# Patient Record
Sex: Female | Born: 1955 | Race: White | Hispanic: No | Marital: Married | Smoking: Former smoker
Health system: Southern US, Community
[De-identification: ages and names within clinical notes are randomized; demographics above are authoritative.]

## PROBLEM LIST (undated history)

## (undated) DIAGNOSIS — Z951 Presence of aortocoronary bypass graft: Secondary | ICD-10-CM

## (undated) DIAGNOSIS — E119 Type 2 diabetes mellitus without complications: Secondary | ICD-10-CM

## (undated) DIAGNOSIS — E782 Mixed hyperlipidemia: Secondary | ICD-10-CM

## (undated) DIAGNOSIS — I2510 Atherosclerotic heart disease of native coronary artery without angina pectoris: Secondary | ICD-10-CM

## (undated) DIAGNOSIS — Z87898 Personal history of other specified conditions: Secondary | ICD-10-CM

## (undated) DIAGNOSIS — I10 Essential (primary) hypertension: Secondary | ICD-10-CM

## (undated) DIAGNOSIS — I5022 Chronic systolic (congestive) heart failure: Secondary | ICD-10-CM

## (undated) HISTORY — PX: CARPAL TUNNEL RELEASE: SHX101

## (undated) HISTORY — DX: Type 2 diabetes mellitus without complications: E11.9

## (undated) HISTORY — DX: Personal history of other specified conditions: Z87.898

## (undated) HISTORY — DX: Chronic systolic (congestive) heart failure: I50.22

## (undated) HISTORY — DX: Essential (primary) hypertension: I10

## (undated) HISTORY — PX: BREAST LUMPECTOMY: SHX2

## (undated) HISTORY — DX: Presence of aortocoronary bypass graft: Z95.1

## (undated) HISTORY — PX: BILATERAL SALPINGOOPHORECTOMY: SHX1223

## (undated) HISTORY — DX: Atherosclerotic heart disease of native coronary artery without angina pectoris: I25.10

## (undated) HISTORY — DX: Mixed hyperlipidemia: E78.2

---

## 2004-03-18 ENCOUNTER — Other Ambulatory Visit: Admission: RE | Admit: 2004-03-18 | Discharge: 2004-03-18 | Payer: Self-pay | Admitting: Dermatology

## 2004-10-25 ENCOUNTER — Ambulatory Visit: Payer: Self-pay | Admitting: Cardiology

## 2005-06-29 ENCOUNTER — Ambulatory Visit: Payer: Self-pay | Admitting: Cardiology

## 2006-09-05 ENCOUNTER — Ambulatory Visit: Payer: Self-pay | Admitting: Cardiology

## 2008-08-22 ENCOUNTER — Ambulatory Visit: Payer: Self-pay | Admitting: Cardiology

## 2011-01-21 ENCOUNTER — Encounter: Payer: Self-pay | Admitting: Cardiology

## 2011-01-21 ENCOUNTER — Ambulatory Visit (INDEPENDENT_AMBULATORY_CARE_PROVIDER_SITE_OTHER): Payer: PRIVATE HEALTH INSURANCE | Admitting: Cardiology

## 2011-01-21 VITALS — Ht 65.0 in | Wt 159.0 lb

## 2011-01-21 DIAGNOSIS — I10 Essential (primary) hypertension: Secondary | ICD-10-CM

## 2011-01-21 DIAGNOSIS — E782 Mixed hyperlipidemia: Secondary | ICD-10-CM

## 2011-01-21 DIAGNOSIS — I2510 Atherosclerotic heart disease of native coronary artery without angina pectoris: Secondary | ICD-10-CM

## 2011-01-21 DIAGNOSIS — E785 Hyperlipidemia, unspecified: Secondary | ICD-10-CM

## 2011-01-21 DIAGNOSIS — I739 Peripheral vascular disease, unspecified: Secondary | ICD-10-CM | POA: Insufficient documentation

## 2011-01-21 DIAGNOSIS — E119 Type 2 diabetes mellitus without complications: Secondary | ICD-10-CM | POA: Insufficient documentation

## 2011-01-21 DIAGNOSIS — R002 Palpitations: Secondary | ICD-10-CM | POA: Insufficient documentation

## 2011-01-21 DIAGNOSIS — Z79899 Other long term (current) drug therapy: Secondary | ICD-10-CM

## 2011-01-21 MED ORDER — SIMVASTATIN 40 MG PO TABS: 40.0000 mg | ORAL_TABLET | Freq: Every evening | ORAL | Status: DC

## 2011-01-21 NOTE — Progress Notes (Signed)
HPI The patient is a very pleasant 55 year old female last seen in 2008. The patient has known atherosclerotic vascular disease as identified by prior cardiac CT scan and more recently with abdominal ultrasound which showed no significant aneurysm but there was clearly atherosclerotic vascular changes. The patient is actually seen today for history of tachycardia palpitations which has been going on for the last 2 years. The patient states that these episodes occur intermittently typically last 30-40 minutes. She was prescribed metoprolol by primary care physician with significant improvement in her symptoms however recently she was at a wedding and still reported palpitations. There is no presyncope or syncope associated with this nor is there any chest pain or shortness of breath. Although her symptoms have improved on beta blocker therapy no documentation has been obtained either with a Holter monitor CardioNet monitor. We do not know the patient atrial fibrillation which could be a significant problem in the setting of her known vascular disease, insulin-dependent diabetes mellitus dyslipidemia hypertension as well as female sex. 12-lead electrocardiogram shows normal sinus rhythm no acute ischemic changes. Of note is that a cardiac CT scan 4 years ago showed a calcium score of 281 and scattered areas of noncritical CAD with tandem 50% RCA stenosis, 70% circumflex stenosis and 50-70% proximal LAD. A followup stress echocardiogram showed no ischemia. Procedures were performed in 2007. A limited bedside echocardiogram was performed which showed the patient has normal LV function and no significant valvular abnormalities.  Allergies  Allergen Reactions  . Sulfa Antibiotics     No current outpatient prescriptions on file prior to visit.    Past Medical History  Diagnosis Date  . Diabetes mellitus     insulin dependent  . Dyslipidemia   . Hypertension     No past surgical history on file.  No  family history on file.  History   Social History  . Marital Status: Single    Spouse Name: N/A    Number of Children: N/A  . Years of Education: N/A   Occupational History  . Not on file.   Social History Main Topics  . Smoking status: Former Smoker -- 0.3 packs/day for 10 years    Types: Cigarettes    Quit date: 05/02/2000  . Smokeless tobacco: Never Used  . Alcohol Use: Not on file  . Drug Use: Not on file  . Sexually Active: Not on file   Other Topics Concern  . Not on file   Social History Narrative  . No narrative on file   [id number] positives as outlined above. The remainder of the 18  point review of systems is negative  PHYSICAL EXAM Ht 5\' 5"  (1.651 m)  Wt 159 lb (72.122 kg)  BMI 26.46 kg/m2  General: Well-developed, well-nourished in no distress Head: Normocephalic and atraumatic Eyes:PERRLA/EOMI intact, conjunctiva and lids normal Ears: No deformity or lesions Mouth:normal dentition, normal posterior pharynx Neck: Supple, no JVD.  No masses, thyromegaly or abnormal cervical nodes Lungs: Normal breath sounds bilaterally without wheezing.  Normal percussion Cardiac: regular rate and rhythm with normal S1 and S2, no S3 or S4.  PMI is normal.  No pathological murmurs Abdomen: Normal bowel sounds, abdomen is soft and nontender without masses, organomegaly or hernias noted.  No hepatosplenomegaly MSK: Back normal, normal gait muscle strength and tone normal Vascular: Pulse is normal in all 4 extremities Extremities: No peripheral pitting edema Neurologic: Alert and oriented x 3 Skin: Intact without lesions or rashes Lymphatics: No significant adenopathy Psychologic: Normal affect  ECG: Normal sinus rhythm no acute ischemic changes  ASSESSMENT AND PLAN

## 2011-01-21 NOTE — Patient Instructions (Signed)
Follow up as scheduled. Start Simvastatin 40 mg every night. Your physician recommends that you go to the [facility] for a FASTING lipid profile and liver function labs. Do not eat or drink after midnight. DO IN 6-8 WEEKS. Your physician has recommended that you wear an event monitor. Event monitors are medical devices that record the heart's electrical activity. Doctors most often [place] these monitors to diagnose arrhythmias. Arrhythmias are problems with the speed or rhythm of the heartbeat. The monitor is a small, portable device. You can wear one while you do your normal daily activities. This is usually used to diagnose what is causing palpitations/syncope (passing out).

## 2011-01-21 NOTE — Assessment & Plan Note (Signed)
Palpitations: Rule out atrial fibrillation CardioNet monitor. If the patient has atrial fibrillation given her multiple risk factors she will need to go on anticoagulant therapy. Continue beta blocker the meanwhile

## 2011-01-21 NOTE — Assessment & Plan Note (Signed)
Dyslipidemia: Given the fact the patient has diabetes mellitus and known vascular disease we will aim for goals for secondary risk prevention with an LDL less than 75 mg percent. The patient be started on simvastatin 40 minutes by mouth each bedtime.

## 2011-01-21 NOTE — Assessment & Plan Note (Signed)
Peripheral vascular disease: Aortic atherosclerotic changes is identified by CT scan of the abdomen

## 2011-01-21 NOTE — Assessment & Plan Note (Signed)
Coronary artery disease: Asymptomatic, nonobstructive CAD by cardiac CT scan 4 years ago with elevated calcium score. Would consider screening stress test after evaluation for arrhythmias

## 2011-01-27 DIAGNOSIS — R002 Palpitations: Secondary | ICD-10-CM

## 2011-03-15 ENCOUNTER — Encounter: Payer: Self-pay | Admitting: Cardiology

## 2011-03-15 ENCOUNTER — Ambulatory Visit (INDEPENDENT_AMBULATORY_CARE_PROVIDER_SITE_OTHER): Payer: PRIVATE HEALTH INSURANCE | Admitting: Cardiology

## 2011-03-15 DIAGNOSIS — E785 Hyperlipidemia, unspecified: Secondary | ICD-10-CM

## 2011-03-15 DIAGNOSIS — I2510 Atherosclerotic heart disease of native coronary artery without angina pectoris: Secondary | ICD-10-CM

## 2011-03-15 DIAGNOSIS — E119 Type 2 diabetes mellitus without complications: Secondary | ICD-10-CM

## 2011-03-15 DIAGNOSIS — R002 Palpitations: Secondary | ICD-10-CM

## 2011-03-15 NOTE — Patient Instructions (Signed)
Your physician wants you to follow-up in: 6 months. You will receive a reminder letter in the mail one-two months in advance. If you don't receive a letter, please call our office to schedule the follow-up appointment. Your physician recommends that you continue on your current medications as directed. Please refer to the Current Medication list given to you today. 

## 2011-03-15 NOTE — Assessment & Plan Note (Signed)
Well-controlled with recent LDL 62, following the addition of simvastatin.

## 2011-03-15 NOTE — Assessment & Plan Note (Signed)
Stable on current dose of Lopressor, with only occasional episodes of very brief duration; essentially asymptomatic. Would increase beta blocker in future, if she has increase in frequency/duration. Recent Cardionet negative for dysrhythmia.

## 2011-03-15 NOTE — Assessment & Plan Note (Signed)
Quiescent on current medication regimen, with presumed CAD, by prior cardiac CT scan. Aggressive primary prevention recommended.

## 2011-03-15 NOTE — Assessment & Plan Note (Signed)
Followed by Dr. Qureshi. 

## 2011-03-15 NOTE — Progress Notes (Signed)
cc:  HPI: Patient returns for scheduled early followup.  At time of last visit, she was referred for a CardioNet monitor, per Dr. Andee Lineman, to rule out dysrhythmia. This yielded no definite evidence of atrial fibrillation. He also started her on simvastatin, given that she has diabetes, and a followup lipid profile yielding LDL 62.  Clinically, she denies any symptoms of exertional chest discomfort. With regard to palpitations, she has only had 2 brief episodes, following completion of her continuous monitoring. These are asymptomatic. During the study, she states that she did not have any palpitations. In general, she suggests that she has had overall improvement in the frequency of her palpitations, since being placed on Lopressor approximately one year ago.  PMH: reviewed and listed in Problem List in electronic Records (and see below)  Allergies/SH/FH: available in Electronic Records for review  Current Outpatient Prescriptions  Medication Sig Dispense Refill  . aspirin EC 81 MG tablet Take 81 mg by mouth daily.        Marland Kitchen CINNAMON PO Take 1 capsule by mouth daily. 1000mg        . enalapril (VASOTEC) 20 MG tablet Take 20 mg by mouth daily.        . insulin glargine (LANTUS) 100 UNIT/ML injection Inject 80 Units into the skin daily.        . insulin lispro (HUMALOG) 100 UNIT/ML injection Inject 20 Units into the skin at bedtime.        . metoprolol tartrate (LOPRESSOR) 25 MG tablet Take 25 mg by mouth 2 (two) times daily.        . Multiple Vitamins-Minerals (CENTRUM SILVER ULTRA WOMENS PO) Take 1 tablet by mouth daily.        . Omega-3 Fatty Acids (FISH OIL) 1200 MG CAPS Take 1 capsule by mouth daily.        . ranitidine (ZANTAC) 150 MG tablet Take 150 mg by mouth daily.        . simvastatin (ZOCOR) 40 MG tablet Take 1 tablet (40 mg total) by mouth every evening.  30 tablet  6    ROS: no nausea, vomiting; no fever, chills; no melena, hematochezia; no claudication  PHYSICAL EXAM:  BP  127/76  Pulse 66  Ht 5\' 5"  (1.651 m)  Wt 161 lb (73.029 kg)  BMI 26.79 kg/m2  GENERAL: well-nourished, well-developed; NAD HEENT: NCAT, PERRLA, EOMI; sclera clear; no xanthelasma NECK: palpable bilateral carotid pulses, no bruits; no JVD; no TM LUNGS: CTA bilaterally CARDIAC: RRR (S1, S2); no significant murmurs; no rubs or gallops ABDOMEN: soft, non-tender; intact BS EXTREMETIES: intact distal pulses; no significant peripheral edema SKIN: warm/dry; no obvious rash/lesions MUSCULOSKELETAL: no joint deformity NEURO: no focal deficit; NL affect   EKG: reviewed and available in Electronic Records    ASSESSMENT & PLAN: Not

## 2011-03-16 NOTE — Progress Notes (Signed)
Patient seen and examined. Discussed the treatment plan. Agree with noted as outlined above.

## 2011-08-16 ENCOUNTER — Other Ambulatory Visit: Payer: Self-pay | Admitting: *Deleted

## 2011-08-16 MED ORDER — SIMVASTATIN 40 MG PO TABS: 40.0000 mg | ORAL_TABLET | Freq: Every evening | ORAL | Status: DC

## 2012-03-09 ENCOUNTER — Other Ambulatory Visit: Payer: Self-pay | Admitting: *Deleted

## 2012-03-09 MED ORDER — SIMVASTATIN 40 MG PO TABS: 40.0000 mg | ORAL_TABLET | Freq: Every evening | ORAL | Status: DC

## 2012-05-10 ENCOUNTER — Other Ambulatory Visit: Payer: Self-pay | Admitting: Cardiology

## 2012-05-10 MED ORDER — SIMVASTATIN 40 MG PO TABS: 40.0000 mg | ORAL_TABLET | Freq: Every evening | ORAL | Status: DC

## 2012-05-11 ENCOUNTER — Other Ambulatory Visit: Payer: Self-pay | Admitting: Cardiology

## 2012-05-11 MED ORDER — SIMVASTATIN 40 MG PO TABS: 40.0000 mg | ORAL_TABLET | Freq: Every evening | ORAL | Status: DC

## 2012-05-25 ENCOUNTER — Other Ambulatory Visit: Payer: Self-pay | Admitting: Cardiology

## 2012-05-25 MED ORDER — SIMVASTATIN 40 MG PO TABS: 40.0000 mg | ORAL_TABLET | Freq: Every evening | ORAL | Status: DC

## 2014-04-21 ENCOUNTER — Encounter: Payer: Self-pay | Admitting: Cardiology

## 2014-04-22 ENCOUNTER — Encounter: Payer: Self-pay | Admitting: *Deleted

## 2014-04-22 ENCOUNTER — Other Ambulatory Visit: Payer: Self-pay | Admitting: Cardiology

## 2014-04-22 ENCOUNTER — Ambulatory Visit (INDEPENDENT_AMBULATORY_CARE_PROVIDER_SITE_OTHER): Payer: PRIVATE HEALTH INSURANCE | Admitting: Cardiology

## 2014-04-22 ENCOUNTER — Telehealth: Payer: Self-pay | Admitting: Cardiology

## 2014-04-22 ENCOUNTER — Encounter: Payer: Self-pay | Admitting: Cardiology

## 2014-04-22 VITALS — BP 132/73 | HR 72 | Ht 65.0 in | Wt 170.0 lb

## 2014-04-22 DIAGNOSIS — I739 Peripheral vascular disease, unspecified: Secondary | ICD-10-CM

## 2014-04-22 DIAGNOSIS — I2510 Atherosclerotic heart disease of native coronary artery without angina pectoris: Secondary | ICD-10-CM

## 2014-04-22 DIAGNOSIS — E1159 Type 2 diabetes mellitus with other circulatory complications: Secondary | ICD-10-CM

## 2014-04-22 DIAGNOSIS — I200 Unstable angina: Secondary | ICD-10-CM

## 2014-04-22 DIAGNOSIS — E785 Hyperlipidemia, unspecified: Secondary | ICD-10-CM

## 2014-04-22 DIAGNOSIS — R002 Palpitations: Secondary | ICD-10-CM

## 2014-04-22 DIAGNOSIS — I447 Left bundle-branch block, unspecified: Secondary | ICD-10-CM | POA: Insufficient documentation

## 2014-04-22 MED ORDER — NITROGLYCERIN 0.4 MG SL SUBL: 0.4000 mg | SUBLINGUAL_TABLET | SUBLINGUAL | Status: DC | PRN

## 2014-04-22 NOTE — Patient Instructions (Signed)
Your physician recommends that you schedule a follow-up appointment in: 2 weeks after heart cath. Your physician recommends that you continue on your current medications as directed. Please refer to the Current Medication list given to you today. Your physician has requested that you have a cardiac catheterization. Cardiac catheterization is used to diagnose and/or treat various heart conditions. Doctors may recommend this procedure for a number of different reasons. The most common reason is to evaluate chest pain. Chest pain can be a symptom of coronary artery disease (CAD), and cardiac catheterization can show whether plaque is narrowing or blocking your heart's arteries. This procedure is also used to evaluate the valves, as well as measure the blood flow and oxygen levels in different parts of your heart. For further information please visit www.cardiosmart.org. Please follow instruction sheet, as given.  

## 2014-04-22 NOTE — Assessment & Plan Note (Signed)
Symptoms outlined above, exertional and progressive over the last year with recent more intense episode. ECG shows left bundle branch block which is new in comparison to a tracing from 2012. She has previously documented coronary atherosclerosis of at least moderate degree based on CT angiogram from 2007. Active cardiac risk factors include long-standing type 2 diabetes mellitus, mild hyperlipidemia, and hypertension. After reviewing the risks and benefits of diagnostic heart catheterization to clearly assess her coronary anatomy and determine if there any revascularization options to consider, she is in agreement to proceed. This is being scheduled for next week (after Christmas). For now continue aspirin, ACE inhibitor, beta blocker, and omega-3 supplements. Nitroglycerin also provided. Lab work will be obtained on the day of the procedure.

## 2014-04-22 NOTE — Telephone Encounter (Signed)
Left heart cath Monday, 04/28/14 @12 :00 noon with Dr. Excell Seltzerooper. [id number]:accelerating angina & CAD Checking on Percert

## 2014-04-22 NOTE — Progress Notes (Signed)
 Reason for visit: Abnormal ECG  Clinical Summary Suzanne Tapia is a 58 y.o.female referred to the office by Dr. Qureshi for cardiology consultation. Record review finds prior evaluation by Dr. DeGent, last seen in 2012. She has a history of coronary atherosclerosis, reportedly documented by CT imaging back in 2007. Dr. DeGent's records indicate that she had a calcium score of 281 and scattered areas of noncritical CAD with tandem 50% RCA stenosis, 70% circumflex stenosis and 50-70% proximal LAD. A followup stress echocardiogram showed no ischemia. She has been managed with risk factor modification strategies. No cardiology follow-up since that time.  She is here with her husband today. She tells me that over the last year she has had recurring episodes of exertional chest tightness and shortness of breath. Symptoms have been more frequent recently, and she had an intense episode while going to and from a concert at the [employer] in [place] this past weekend. She had to stop several times walking back to her car. She went to see Dr. Qureshi and ECG showed a "new" left bundle branch block, at least compared to tracings from a few years ago.  She has not had follow-up ischemic testing in several years. She tells me that she has not been able to tolerate statins in the form of either Zocor or Lipitor due to achiness. She has been taking omega-3 supplements. Otherwise medical regimen includes aspirin, Vasotec, Lopressor, and insulin.  Recent ECG shows normal sinus rhythm with left bundle branch block and associated repolarization abnormalities - reconfirmed today by repeat tracing. This is new compared to tracing from 2012.  Today we discussed her history including prior cardiovascular testing, recent ECG, and progressive symptoms. This is concerning for accelerating angina in the setting of previously documented cardiovascular disease and active risk factors. We discussed arranging a diagnostic heart  catheterization to clearly define her coronary anatomy and assess for revascularization options. After reviewing the risks and benefits, she is in agreement to proceed.   Allergies  Allergen Reactions  . Sulfa Antibiotics     Current Outpatient Prescriptions  Medication Sig Dispense Refill  . aspirin EC 81 MG tablet Take 81 mg by mouth daily.      . enalapril (VASOTEC) 20 MG tablet Take 20 mg by mouth daily.      . insulin glargine (LANTUS) 100 UNIT/ML injection Inject 90 Units into the skin daily.     . insulin lispro (HUMALOG) 100 UNIT/ML injection Inject 75 Units into the skin daily.     . metoprolol tartrate (LOPRESSOR) 25 MG tablet Take 25 mg by mouth daily.     . Multiple Vitamins-Minerals (CENTRUM SILVER ULTRA WOMENS PO) Take 1 tablet by mouth daily.      . Omega-3 Fatty Acids (FISH OIL) 1200 MG CAPS Take 1 capsule by mouth daily.      . ranitidine (ZANTAC) 150 MG tablet Take 150 mg by mouth daily.      . nitroGLYCERIN (NITROSTAT) 0.4 MG SL tablet Place 1 tablet (0.4 mg total) under the tongue every 5 (five) minutes x 3 doses as needed for chest pain. 25 tablet 3   No current facility-administered medications for this visit.    Past Medical History  Diagnosis Date  . Type 2 diabetes mellitus   . Mixed hyperlipidemia   . Essential hypertension   . Coronary atherosclerosis     Reportedly documented by CT examination 2007  - Dr.  DeGent  . History of palpitations     Negative   CardioNet monitor 2012    Past Surgical History  Procedure Laterality Date  . Breast lumpectomy Bilateral   . Bilateral salpingoophorectomy    . Carpal tunnel release Left     Family History  Problem Relation Age of Onset  . Heart failure Mother     Died age 56  . Diabetes Mellitus II Mother   . CAD Sister     Diagnosed late 660's    Social History Suzanne Tapia reports that she quit smoking about 13 years ago. Her smoking use included Cigarettes. She started smoking about 33 years ago. She has  a 3 pack-year smoking history. She has never used smokeless tobacco. Suzanne Tapia reports that she does not drink alcohol.  Review of Systems Complete review of systems negative except as otherwise outlined in the clinical summary and also the following. No recent palpitations, no syncope. No reported bleeding problems. No claudication. Stable appetite. No orthopnea or PND.  Physical Examination Filed Vitals:   04/22/14 1027  BP: 132/73  Pulse: 72   Filed Weights   04/22/14 1027  Weight: 170 lb (77.111 kg)   Overweight woman, appears comfortable at rest. HEENT: Conjunctiva and lids normal, oropharynx clear. Neck: Supple, no elevated JVP, possible right carotid bruit, no thyromegaly. Lungs: Clear to auscultation, nonlabored breathing at rest. Cardiac: Regular rate and rhythm, no S3, 2/6 basal systolic murmur, no pericardial rub. Abdomen: Soft, nontender, no hepatomegaly, bowel sounds present, no guarding or rebound. Extremities: No pitting edema, distal pulses 2+. Skin: Warm and dry. Musculoskeletal: No kyphosis. Neuropsychiatric: Alert and oriented x3, affect grossly appropriate.   Problem List and Plan   Accelerating angina Symptoms outlined above, exertional and progressive over the last year with recent more intense episode. ECG shows left bundle branch block which is new in comparison to a tracing from 2012. She has previously documented coronary atherosclerosis of at least moderate degree based on CT angiogram from 2007. Active cardiac risk factors include long-standing type 2 diabetes mellitus, mild hyperlipidemia, and hypertension. After reviewing the risks and benefits of diagnostic heart catheterization to clearly assess her coronary anatomy and determine if there any revascularization options to consider, she is in agreement to proceed. This is being scheduled for next week (after Christmas). For now continue aspirin, ACE inhibitor, beta blocker, and omega-3 supplements.  Nitroglycerin also provided. Lab work will be obtained on the day of the procedure.  Coronary artery disease Based on previous evaluation via CT angiogram of the chest in 2007 with abnormal calcium score as well. Dr. [name]'s prior note indicates what sounds like at least moderate multivessel distribution disease, although a nonischemic stress echocardiogram at that point.  Dyslipidemia She is on omega-3 supplements at this time. Lipids have been followed by Dr. Virgina OrganQureshi. She reports intolerances with Zocor and Lipitor. May need to consider trying a different statin going forward. Will continue to discussed with her.  Type 2 diabetes mellitus Followed by Dr. Virgina OrganQureshi, on insulin.    Jonelle SidleSamuel G. McDowell, M.D., F.A.C.C.

## 2014-04-22 NOTE — Assessment & Plan Note (Signed)
Based on previous evaluation via CT angiogram of the chest in 2007 with abnormal calcium score as well. Dr. [name]'s prior note indicates what sounds like at least moderate multivessel distribution disease, although a nonischemic stress echocardiogram at that point.

## 2014-04-22 NOTE — Assessment & Plan Note (Signed)
Followed by Dr. Virgina OrganQureshi, on insulin.

## 2014-04-22 NOTE — Assessment & Plan Note (Signed)
She is on omega-3 supplements at this time. Lipids have been followed by Dr. Virgina OrganQureshi. She reports intolerances with Zocor and Lipitor. May need to consider trying a different statin going forward. Will continue to discussed with her.

## 2014-04-23 MED ORDER — SODIUM CHLORIDE 0.9 % IJ SOLN: 3.0000 mL | Freq: Two times a day (BID) | INTRAMUSCULAR | Status: DC

## 2014-04-23 MED ORDER — SODIUM CHLORIDE 0.9 % IJ SOLN: 3.0000 mL | INTRAMUSCULAR | Status: DC | PRN

## 2014-04-23 MED ORDER — SODIUM CHLORIDE 0.9 % IV SOLN: INTRAVENOUS | Status: DC

## 2014-04-23 MED ORDER — ASPIRIN 81 MG PO CHEW: 81.0000 mg | CHEWABLE_TABLET | ORAL | Status: AC

## 2014-04-23 MED ORDER — SODIUM CHLORIDE 0.9 % IV SOLN: 250.0000 mL | INTRAVENOUS | Status: DC | PRN

## 2014-04-28 ENCOUNTER — Encounter (HOSPITAL_COMMUNITY)
Admission: RE | Disposition: A | Discharge status: Home / Self Care | Payer: Self-pay | Source: Ambulatory Visit | Attending: Cardiovascular Disease

## 2014-04-28 ENCOUNTER — Ambulatory Visit (HOSPITAL_COMMUNITY)
Admission: RE | Admit: 2014-04-28 | Discharge: 2014-04-28 | Disposition: A | Discharge status: Home / Self Care | Payer: PRIVATE HEALTH INSURANCE | Source: Ambulatory Visit | Attending: Cardiovascular Disease | Admitting: Cardiovascular Disease

## 2014-04-28 ENCOUNTER — Encounter (HOSPITAL_COMMUNITY): Payer: Self-pay | Admitting: *Deleted

## 2014-04-28 DIAGNOSIS — Z87891 Personal history of nicotine dependence: Secondary | ICD-10-CM | POA: Diagnosis not present

## 2014-04-28 DIAGNOSIS — E782 Mixed hyperlipidemia: Secondary | ICD-10-CM | POA: Insufficient documentation

## 2014-04-28 DIAGNOSIS — I25118 Atherosclerotic heart disease of native coronary artery with other forms of angina pectoris: Secondary | ICD-10-CM

## 2014-04-28 DIAGNOSIS — Z794 Long term (current) use of insulin: Secondary | ICD-10-CM | POA: Diagnosis not present

## 2014-04-28 DIAGNOSIS — Z79899 Other long term (current) drug therapy: Secondary | ICD-10-CM | POA: Insufficient documentation

## 2014-04-28 DIAGNOSIS — Z7982 Long term (current) use of aspirin: Secondary | ICD-10-CM | POA: Insufficient documentation

## 2014-04-28 DIAGNOSIS — I447 Left bundle-branch block, unspecified: Secondary | ICD-10-CM | POA: Diagnosis not present

## 2014-04-28 DIAGNOSIS — I209 Angina pectoris, unspecified: Secondary | ICD-10-CM

## 2014-04-28 DIAGNOSIS — Z833 Family history of diabetes mellitus: Secondary | ICD-10-CM | POA: Diagnosis not present

## 2014-04-28 DIAGNOSIS — I200 Unstable angina: Secondary | ICD-10-CM

## 2014-04-28 DIAGNOSIS — E109 Type 1 diabetes mellitus without complications: Secondary | ICD-10-CM | POA: Diagnosis not present

## 2014-04-28 DIAGNOSIS — I10 Essential (primary) hypertension: Secondary | ICD-10-CM | POA: Diagnosis not present

## 2014-04-28 DIAGNOSIS — I25119 Atherosclerotic heart disease of native coronary artery with unspecified angina pectoris: Secondary | ICD-10-CM | POA: Diagnosis present

## 2014-04-28 HISTORY — PX: LEFT HEART CATHETERIZATION WITH CORONARY ANGIOGRAM: SHX5451

## 2014-04-28 LAB — CBC
HEMATOCRIT: 38.7 % (ref 36.0–46.0)
Hemoglobin: 13.3 g/dL (ref 12.0–15.0)
MCH: 32.8 pg (ref 26.0–34.0)
MCHC: 34.4 g/dL (ref 30.0–36.0)
MCV: 95.6 fL (ref 78.0–100.0)
Platelets: 441 10*3/uL — ABNORMAL HIGH (ref 150–400)
RBC: 4.05 MIL/uL (ref 3.87–5.11)
RDW: 12.1 % (ref 11.5–15.5)
WBC: 8.2 10*3/uL (ref 4.0–10.5)

## 2014-04-28 LAB — BASIC METABOLIC PANEL
ANION GAP: 8 (ref 5–15)
BUN: 14 mg/dL (ref 6–23)
CHLORIDE: 105 meq/L (ref 96–112)
CO2: 24 mmol/L (ref 19–32)
CREATININE: 0.73 mg/dL (ref 0.50–1.10)
Calcium: 9.4 mg/dL (ref 8.4–10.5)
GFR calc Af Amer: >90 mL/min (ref >90)
GFR calc non Af Amer: >90 mL/min (ref >90)
Glucose, Bld: 210 mg/dL — ABNORMAL HIGH (ref 70–99)
Potassium: 5.2 mmol/L — ABNORMAL HIGH (ref 3.5–5.1)
Sodium: 137 mmol/L (ref 135–145)

## 2014-04-28 LAB — POCT I-STAT, CHEM 8
BUN: 16 mg/dL (ref 6–23)
Calcium, Ion: 1.3 mmol/L — ABNORMAL HIGH (ref 1.12–1.23)
Chloride: 104 mEq/L (ref 96–112)
Creatinine, Ser: 0.8 mg/dL (ref 0.50–1.10)
GLUCOSE: 185 mg/dL — AB (ref 70–99)
HCT: 42 % (ref 36.0–46.0)
Hemoglobin: 14.3 g/dL (ref 12.0–15.0)
Potassium: 4.3 mmol/L (ref 3.5–5.1)
Sodium: 141 mmol/L (ref 135–145)
TCO2: 21 mmol/L (ref 0–100)

## 2014-04-28 LAB — GLUCOSE, CAPILLARY: Glucose-Capillary: 145 mg/dL — ABNORMAL HIGH (ref 70–99)

## 2014-04-28 LAB — PROTIME-INR
INR: 0.96 (ref 0.00–1.49)
Prothrombin Time: 12.9 seconds (ref 11.6–15.2)

## 2014-04-28 SURGERY — LEFT HEART CATHETERIZATION WITH CORONARY ANGIOGRAM
Anesthesia: LOCAL

## 2014-04-28 MED ORDER — FENTANYL CITRATE 0.05 MG/ML IJ SOLN
INTRAMUSCULAR | Status: AC
  Filled 2014-04-28: qty 2

## 2014-04-28 MED ORDER — SODIUM CHLORIDE 0.9 % IV SOLN: 250.0000 mL | INTRAVENOUS | Status: DC | PRN

## 2014-04-28 MED ORDER — SODIUM CHLORIDE 0.9 % IJ SOLN: 3.0000 mL | Freq: Two times a day (BID) | INTRAMUSCULAR | Status: DC

## 2014-04-28 MED ORDER — LIDOCAINE HCL (PF) 1 % IJ SOLN
INTRAMUSCULAR | Status: AC
  Filled 2014-04-28: qty 30

## 2014-04-28 MED ORDER — ISOSORBIDE MONONITRATE ER 30 MG PO TB24: 15.0000 mg | ORAL_TABLET | Freq: Every day | ORAL | Status: DC

## 2014-04-28 MED ORDER — HEPARIN SODIUM (PORCINE) 1000 UNIT/ML IJ SOLN
INTRAMUSCULAR | Status: AC
  Filled 2014-04-28: qty 1

## 2014-04-28 MED ORDER — MIDAZOLAM HCL 2 MG/2ML IJ SOLN
INTRAMUSCULAR | Status: AC
  Filled 2014-04-28: qty 2

## 2014-04-28 MED ORDER — SODIUM CHLORIDE 0.9 % IJ SOLN: 3.0000 mL | INTRAMUSCULAR | Status: DC | PRN

## 2014-04-28 MED ORDER — HEPARIN (PORCINE) IN NACL 2-0.9 UNIT/ML-% IJ SOLN
INTRAMUSCULAR | Status: AC
  Filled 2014-04-28: qty 1000

## 2014-04-28 MED ORDER — NITROGLYCERIN 1 MG/10 ML FOR IR/CATH LAB
INTRA_ARTERIAL | Status: AC
  Filled 2014-04-28: qty 10

## 2014-04-28 MED ORDER — VERAPAMIL HCL 2.5 MG/ML IV SOLN
INTRAVENOUS | Status: AC
Start: 2014-04-28 — End: 2014-04-28
  Filled 2014-04-28: qty 2

## 2014-04-28 MED ORDER — ASPIRIN 81 MG PO CHEW: 81.0000 mg | CHEWABLE_TABLET | Freq: Once | ORAL | Status: DC

## 2014-04-28 MED ORDER — SODIUM CHLORIDE 0.9 % IV SOLN: INTRAVENOUS | Status: DC

## 2014-04-28 NOTE — H&P (View-Only) (Signed)
Reason for visit: Abnormal ECG  Clinical Summary Suzanne Tapia is a 58 y.o.female referred to the office by Dr. Virgina Tapia for cardiology consultation. Record review finds prior evaluation by Dr. Andee Tapia, last seen in 2012. She has a history of coronary atherosclerosis, reportedly documented by CT imaging back in 2007. Dr. Margarita Tapia's records indicate that she had a calcium score of 281 and scattered areas of noncritical CAD with tandem 50% RCA stenosis, 70% circumflex stenosis and 50-70% proximal LAD. A followup stress echocardiogram showed no ischemia. She has been managed with risk factor modification strategies. No cardiology follow-up since that time.  She is here with her husband today. She tells me that over the last year she has had recurring episodes of exertional chest tightness and shortness of breath. Symptoms have been more frequent recently, and she had an intense episode while going to and from a [place] the [employer] in [place] this past weekend. She had to stop several times walking back to her car. She went to see Dr. Virgina Tapia and ECG showed a "new" left bundle branch block, at least compared to tracings from a few years ago.  She has not had follow-up ischemic testing in several years. She tells me that she has not been able to tolerate statins in the form of either Zocor or Lipitor due to achiness. She has been taking omega-3 supplements. Otherwise medical regimen includes aspirin, Vasotec, Lopressor, and insulin.  Recent ECG shows normal sinus rhythm with left bundle branch block and associated repolarization abnormalities - reconfirmed today by repeat tracing. This is new compared to tracing from 2012.  Today we discussed her history including prior cardiovascular testing, recent ECG, and progressive symptoms. This is concerning for accelerating angina in the setting of previously documented cardiovascular disease and active risk factors. We discussed arranging a diagnostic heart  catheterization to clearly define her coronary anatomy and assess for revascularization options. After reviewing the risks and benefits, she is in agreement to proceed.   Allergies  Allergen Reactions  . Sulfa Antibiotics     Current Outpatient Prescriptions  Medication Sig Dispense Refill  . aspirin EC 81 MG tablet Take 81 mg by mouth daily.      . enalapril (VASOTEC) 20 MG tablet Take 20 mg by mouth daily.      . insulin glargine (LANTUS) 100 UNIT/ML injection Inject 90 Units into the skin daily.     . insulin lispro (HUMALOG) 100 UNIT/ML injection Inject 75 Units into the skin daily.     . metoprolol tartrate (LOPRESSOR) 25 MG tablet Take 25 mg by mouth daily.     . Multiple Vitamins-Minerals (CENTRUM SILVER ULTRA WOMENS PO) Take 1 tablet by mouth daily.      . Omega-3 Fatty Acids (FISH OIL) 1200 MG CAPS Take 1 capsule by mouth daily.      . ranitidine (ZANTAC) 150 MG tablet Take 150 mg by mouth daily.      . nitroGLYCERIN (NITROSTAT) 0.4 MG SL tablet Place 1 tablet (0.4 mg total) under the tongue every 5 (five) minutes x 3 doses as needed for chest pain. 25 tablet 3   No current facility-administered medications for this visit.    Past Medical History  Diagnosis Date  . Type 2 diabetes mellitus   . Mixed hyperlipidemia   . Essential hypertension   . Coronary atherosclerosis     Reportedly documented by CT examination 2007  - Dr.  Andee Tapia  . History of palpitations     Negative  CardioNet monitor 2012    Past Surgical History  Procedure Laterality Date  . Breast lumpectomy Bilateral   . Bilateral salpingoophorectomy    . Carpal tunnel release Left     Family History  Problem Relation Age of Onset  . Heart failure Mother     Died age 56  . Diabetes Mellitus II Mother   . CAD Sister     Diagnosed late 56's    Social History Ms. Suzanne Tapia reports that she quit smoking about 13 years ago. Her smoking use included Cigarettes. She started smoking about 33 years ago. She has  a 3 pack-year smoking history. She has never used smokeless tobacco. Ms. Suzanne Tapia reports that she does not drink alcohol.  Review of Systems Complete review of systems negative except as otherwise outlined in the clinical summary and also the following. No recent palpitations, no syncope. No reported bleeding problems. No claudication. Stable appetite. No orthopnea or PND.  Physical Examination Filed Vitals:   04/22/14 1027  BP: 132/73  Pulse: 72   Filed Weights   04/22/14 1027  Weight: 170 lb (77.111 kg)   Overweight woman, appears comfortable at rest. HEENT: Conjunctiva and lids normal, oropharynx clear. Neck: Supple, no elevated JVP, possible right carotid bruit, no thyromegaly. Lungs: Clear to auscultation, nonlabored breathing at rest. Cardiac: Regular rate and rhythm, no S3, 2/6 basal systolic murmur, no pericardial rub. Abdomen: Soft, nontender, no hepatomegaly, bowel sounds present, no guarding or rebound. Extremities: No pitting edema, distal pulses 2+. Skin: Warm and dry. Musculoskeletal: No kyphosis. Neuropsychiatric: Alert and oriented x3, affect grossly appropriate.   Problem List and Plan   Accelerating angina Symptoms outlined above, exertional and progressive over the last year with recent more intense episode. ECG shows left bundle branch block which is new in comparison to a tracing from 2012. She has previously documented coronary atherosclerosis of at least moderate degree based on CT angiogram from 2007. Active cardiac risk factors include long-standing type 2 diabetes mellitus, mild hyperlipidemia, and hypertension. After reviewing the risks and benefits of diagnostic heart catheterization to clearly assess her coronary anatomy and determine if there any revascularization options to consider, she is in agreement to proceed. This is being scheduled for next week (after Christmas). For now continue aspirin, ACE inhibitor, beta blocker, and omega-3 supplements.  Nitroglycerin also provided. Lab work will be obtained on the day of the procedure.  Coronary artery disease Based on previous evaluation via CT angiogram of the chest in 2007 with abnormal calcium score as well. Dr. Margarita Tapia's prior note indicates what sounds like at least moderate multivessel distribution disease, although a nonischemic stress echocardiogram at that point.  Dyslipidemia She is on omega-3 supplements at this time. Lipids have been followed by Dr. Virgina Tapia. She reports intolerances with Zocor and Lipitor. May need to consider trying a different statin going forward. Will continue to discussed with her.  Type 2 diabetes mellitus Followed by Dr. Virgina Tapia, on insulin.    Jonelle SidleSamuel G. McDowell, M.D., F.A.C.C.

## 2014-04-28 NOTE — Discharge Instructions (Signed)
Radial Site Care °Refer to this sheet in the next few weeks. These instructions provide you with information on caring for yourself after your procedure. Your caregiver may also give you more specific instructions. Your treatment has been planned according to current medical practices, but problems sometimes occur. Call your caregiver if you have any problems or questions after your procedure. °HOME CARE INSTRUCTIONS °· You may shower the day after the procedure. Remove the bandage (dressing) and gently wash the site with plain soap and water. Gently pat the site dry. °· Do not apply powder or lotion to the site. °· Do not submerge the affected site in water for 3 to 5 days. °· Inspect the site at least twice daily. °· Do not flex or bend the affected arm for 24 hours. °· No lifting over 5 pounds (2.3 kg) for 5 days after your procedure. °· Do not drive home if you are discharged the same day of the procedure. Have someone else drive you. °· You may drive 24 hours after the procedure unless otherwise instructed by your caregiver. °· Do not operate machinery or power tools for 24 hours. °· A responsible adult should be with you for the first 24 hours after you arrive home. °What to expect: °· Any bruising will usually fade within 1 to 2 weeks. °· Blood that collects in the tissue (hematoma) may be painful to the touch. It should usually decrease in size and tenderness within 1 to 2 weeks. °SEEK IMMEDIATE MEDICAL CARE IF: °· You have unusual pain at the radial site. °· You have redness, warmth, swelling, or pain at the radial site. °· You have drainage (other than a small amount of blood on the dressing). °· You have chills. °· You have a fever or persistent symptoms for more than 72 hours. °· You have a fever and your symptoms suddenly get worse. °· Your arm becomes pale, cool, tingly, or numb. °· You have heavy bleeding from the site. Hold pressure on the site. °Document Released: 05/21/2010 Document Revised:  07/11/2011 Document Reviewed: 05/21/2010 °ExitCare® Patient Information ©2015 [employer]. This information is not intended to replace advice given to you by your health care provider. Make sure you discuss any questions you have with your health care provider. ° °

## 2014-04-28 NOTE — Progress Notes (Addendum)
Discharge instructions given per MD order.  Pt and CG able to verbalize instructions .  Pt denies any discomfort at site.  Pt  Car via wheelchair.

## 2014-04-28 NOTE — CV Procedure (Signed)
    Cardiac Catheterization Procedure Note  Name: Suzanne Tapia MRN: [id number] DOB: [birth year]  Procedure: Left Heart Cath, Selective Coronary Angiography, LV angiography  Indication: CCS Class 3 angina, known CAD by previous cardiac CTA, Type 1 DM at high risk of multivessel CAD.   Procedural Details: The right wrist was prepped, draped, and anesthetized with 1% lidocaine. Using the modified Seldinger technique, a 5/6 French Slender sheath was introduced into the right radial artery. 3 mg of verapamil was administered through the sheath, weight-based unfractionated heparin was administered intravenously. Standard Judkins catheters were used for selective coronary angiography and left ventriculography. Catheter exchanges were performed over an exchange length guidewire. There were no immediate procedural complications. A TR band was used for radial hemostasis at the completion of the procedure.  The patient was transferred to the post catheterization recovery area for further monitoring.  Procedural Findings: Hemodynamics: AO 118/52 LV 118/7  Coronary angiography: Coronary dominance: right  Left mainstem: The left main stem is short. The vessel was calcified. The left main is widely patent without obstruction.  Left anterior descending (LAD): The LAD is severely calcified, especially in the midportion of the vessel. The proximal LAD is patent with scattered 20% stenoses. The diagonal branches are patent with mild ostial stenosis. The mid LAD has 50% stenosis. The LAD reaches the LV apex. There is no high-grade obstructive disease identified throughout the LAD distribution.  Left circumflex (LCx): The left circumflex is patent. There are 3 obtuse marginal branches present. The mid AV circumflex just after the second obtuse marginal has a 50-60% stenosis. The OM branches are small and diffusely diseased. The third OM is the largest of the obtuse marginal branches.  Right coronary artery  (RCA): The RCA is diffusely diseased through its midportion. There is diffuse 30-40% stenosis present. The PDA and PLA branches are patent without significant stenosis. There is diffuse calcification of the proximal and mid RCA.  Left ventriculography: Left ventricular systolic function is normal, LVEF is estimated at 65%, there is no significant mitral regurgitation   Estimated Blood Loss: Minimal  Final Conclusions:   1. Mild to moderate diffuse coronary artery disease  2. Normal LV systolic function  Recommendations: Recommend escalate anti-anginal Rx in this patient with diffuse CAD and diabetes. There do not appear to be any severe coronary lesions and there may be a component of microvascular angina as her symptoms are highly typical of angina. Will start Imdur 15 mg daily and arrange follow-up with Dr Diona BrownerMcDowell.  Tonny BollmanMichael Judyth Demarais MD, Edward PlainfieldFACC 04/28/2014, 3:27 PM

## 2014-04-28 NOTE — Interval H&P Note (Signed)
History and Physical Interval Note:  04/28/2014 11:16 AM  Suzanne Tapia  has presented today for surgery, with the diagnosis of cad/angina  The various methods of treatment have been discussed with the patient and family. After consideration of risks, benefits and other options for treatment, the patient has consented to  Procedure(s): LEFT HEART CATHETERIZATION WITH CORONARY ANGIOGRAM (N/A) as a surgical intervention .  The patient's history has been reviewed, patient examined, no change in status, stable for surgery.  I have reviewed the patient's chart and labs.  Questions were answered to the patient's satisfaction.    Cath Lab Visit (complete for each Cath Lab visit)  Clinical Evaluation Leading to the Procedure:   ACS: No.  Non-ACS:    Anginal Classification: CCS III  Anti-ischemic medical therapy: Minimal Therapy (1 class of medications)  Non-Invasive Test Results: No non-invasive testing performed  Prior CABG: No previous CABG       Tonny BollmanMichael Zahriah Roes

## 2014-04-30 IMAGING — CR DG WRIST COMPLETE 3+V*L*
3 series · 3 of 3 positions shown · non-contrast
Comparison: None.

CLINICAL DATA: Trauma and pain.

LEFT WRIST - COMPLETE 3+ VIEW

[view not recorded (1 of 3)]
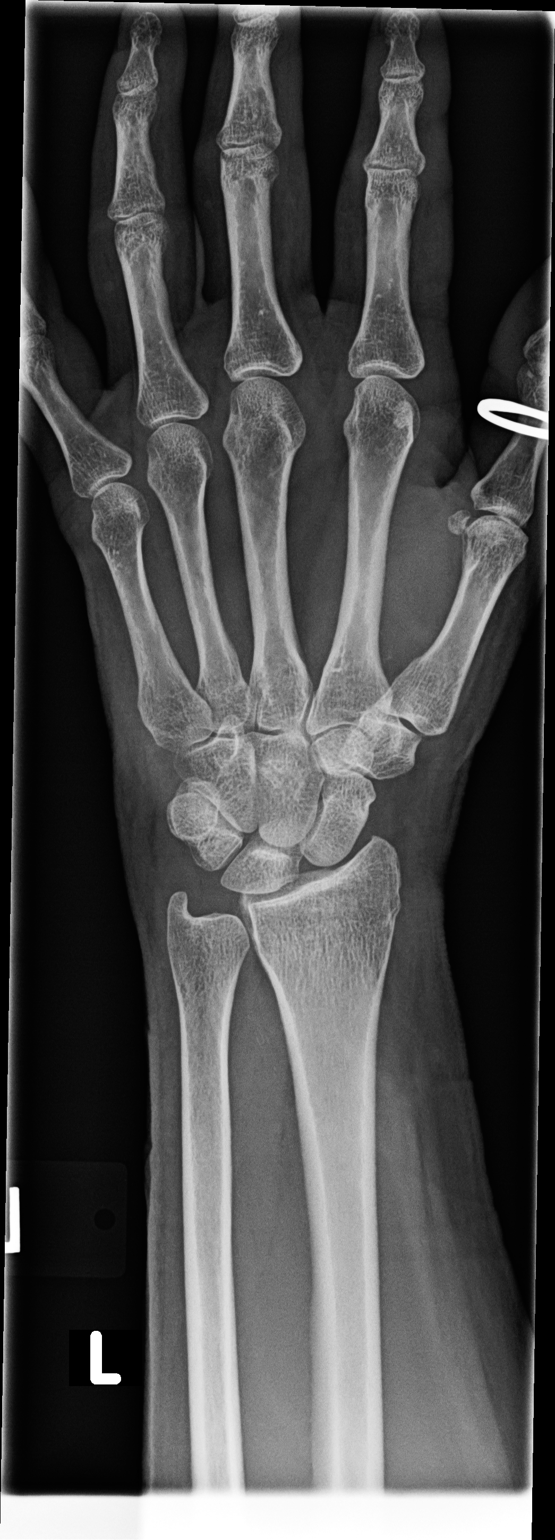

[view not recorded (2 of 3)]
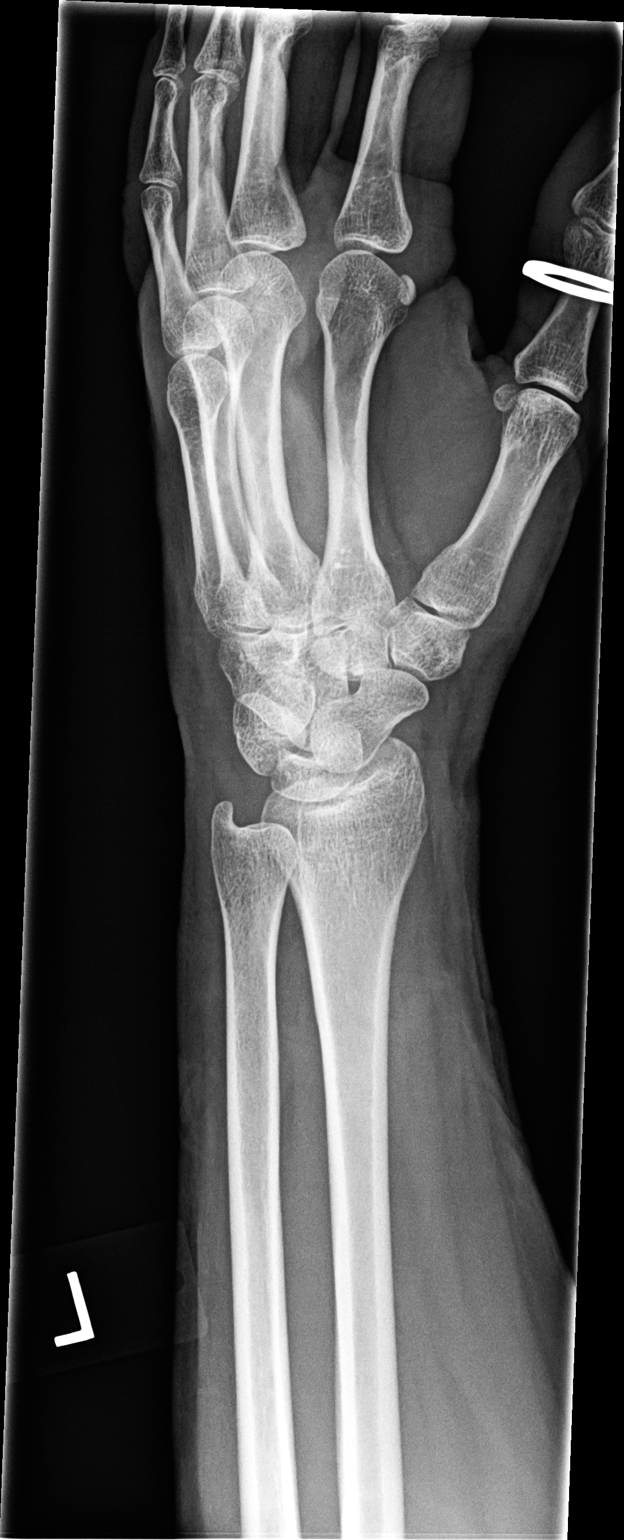

[view not recorded (3 of 3)]
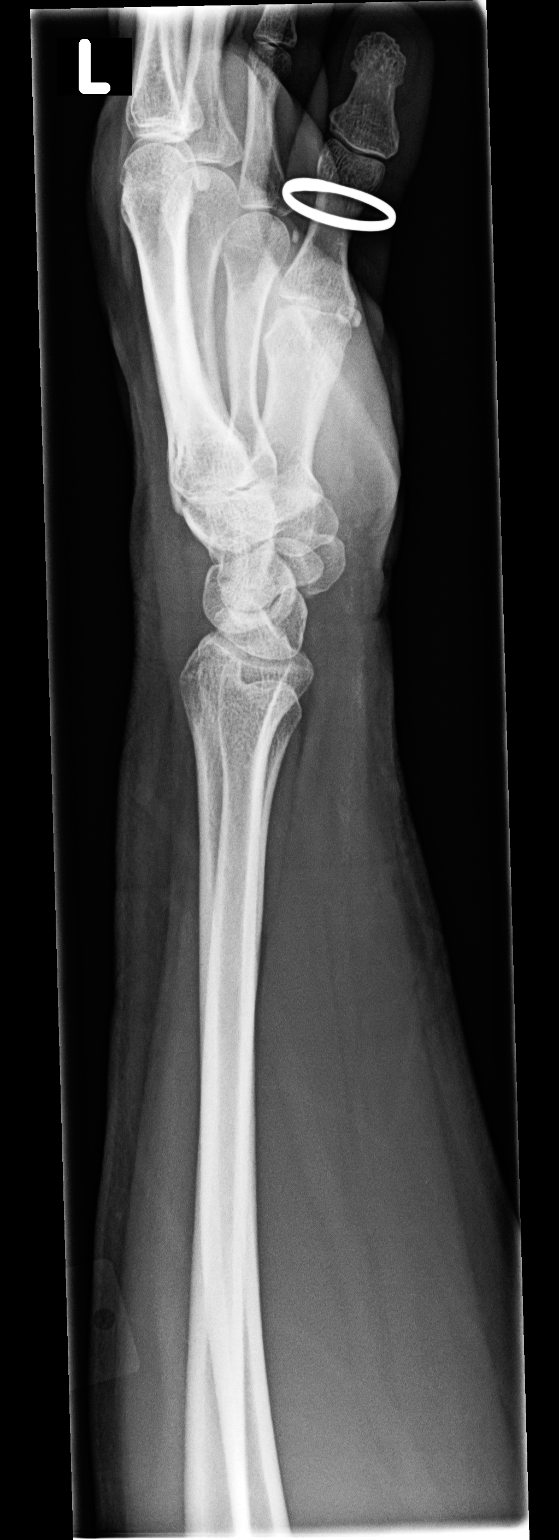

[3 of 3 positions shown; findings below may reference images not displayed]

FINDINGS: Mild osteopenia for age. No acute fracture or
dislocation.
IMPRESSION: No acute osseous abnormality.

## 2014-05-06 ENCOUNTER — Ambulatory Visit (INDEPENDENT_AMBULATORY_CARE_PROVIDER_SITE_OTHER): Payer: PRIVATE HEALTH INSURANCE | Admitting: Cardiology

## 2014-05-06 ENCOUNTER — Encounter: Payer: Self-pay | Admitting: Cardiology

## 2014-05-06 VITALS — BP 133/71 | HR 67 | Ht 65.0 in | Wt 172.0 lb

## 2014-05-06 DIAGNOSIS — I25119 Atherosclerotic heart disease of native coronary artery with unspecified angina pectoris: Secondary | ICD-10-CM

## 2014-05-06 DIAGNOSIS — E785 Hyperlipidemia, unspecified: Secondary | ICD-10-CM

## 2014-05-06 MED ORDER — METOPROLOL SUCCINATE ER 25 MG PO TB24: 12.5000 mg | ORAL_TABLET | Freq: Every day | ORAL | Status: DC

## 2014-05-06 MED ORDER — ISOSORBIDE MONONITRATE ER 30 MG PO TB24: 15.0000 mg | ORAL_TABLET | Freq: Every day | ORAL | Status: DC

## 2014-05-06 NOTE — Assessment & Plan Note (Signed)
Nonobstructive disease as outlined above by recent cardiac catheterization. Plan is to continue medical therapy, diet and exercise regimen. Plan is to switch her from Lopressor to Toprol-XL 12.5 mg daily. I also asked her to increase Imdur to 30 mg daily if she is not noticing any improved angina symptoms in the next few weeks. Follow-up in the next 6 months.

## 2014-05-06 NOTE — Patient Instructions (Signed)
Your physician wants you to follow-up in: 6 months with Dr. Randa SpikeMcDowell You will receive a reminder letter in the mail two months in advance. If you don't receive a letter, please call our office to schedule the follow-up appointment.  Your physician has recommended you make the following change in your medication:   FINISH LOPRESSOR AND THEN START TOPROL XL 12.5MG  DAILY  INCREASE IMDUR TO 30MG  DAILY AS NEEDED FOR ANGINA  Thank you for choosing [facility]!!

## 2014-05-06 NOTE — Progress Notes (Signed)
Reason for visit: Hospital follow-up, CAD  Clinical Summary Ms. Angelos is a 59 y.o.female seen recently in the office in December 2015. She was referred at that time for diagnostic cardiac catheterization with progressive angina symptoms in the setting of previously documented coronary atherosclerosis by noninvasive CT imaging.  Cardiac catheterization was performed by Dr. Excell Seltzer on December 28, demonstrating only mild to moderate diffuse CAD - 50% mid LAD stenosis, 50-60% mid circumflex stenosis, 30-40% RCA stenosis, LVEF 65%. Medical therapy was recommended, and she was started on low-dose Imdur. She comes in today with her husband. States that she has been trying to do some more walking on a regular basis, still has intermittent angina symptoms. We discussed the results of her heart catheterization, plan for medical therapy, diet and exercise.  I reviewed her medications. She has been taking Lopressor once a day.  Patient reports prior history of statin intolerance including Zocor and Lipitor. She continues on omega-3 supplements.  Allergies  Allergen Reactions  . Sulfa Antibiotics Other (See Comments)    REACTION: hyperactivity    Current Outpatient Prescriptions  Medication Sig Dispense Refill  . aspirin EC 81 MG tablet Take 81 mg by mouth daily.      . enalapril (VASOTEC) 20 MG tablet Take 20 mg by mouth daily.      . insulin glargine (LANTUS) 100 UNIT/ML injection Inject 90 Units into the skin daily.     . insulin lispro (HUMALOG) 100 UNIT/ML injection Inject 75 Units into the skin daily.     . isosorbide mononitrate (IMDUR) 30 MG 24 hr tablet Take 0.5 tablets (15 mg total) by mouth daily. May take 1 tablet ( ) as needed for angina 30 tablet 6  . Multiple Vitamins-Minerals (CENTRUM SILVER ULTRA WOMENS PO) Take 1 tablet by mouth daily.      . nitroGLYCERIN (NITROSTAT) 0.4 MG SL tablet Place 0.4 mg under the tongue every 5 (five) minutes as needed for chest pain.    . Omega-3  Fatty Acids (FISH OIL) 1200 MG CAPS Take 1 capsule by mouth daily.      . ranitidine (ZANTAC) 150 MG tablet Take 150 mg by mouth daily.      . metoprolol succinate (TOPROL XL) 25 MG 24 hr tablet Take 0.5 tablets (12.5 mg total) by mouth daily. 45 tablet 3   No current facility-administered medications for this visit.    Past Medical History  Diagnosis Date  . Type 2 diabetes mellitus   . Mixed hyperlipidemia   . Essential hypertension   . Coronary atherosclerosis     Reportedly documented by CT examination 2007 - Dr.  Andee Lineman  . History of palpitations     Negative CardioNet monitor 2012    Social History Ms. Laplante reports that she quit smoking about 14 years ago. Her smoking use included Cigarettes. She started smoking about 33 years ago. She has a 3 pack-year smoking history. She has never used smokeless tobacco. Ms. Glauser reports that she does not drink alcohol.  Review of Systems Complete review of systems negative except as otherwise outlined in the clinical summary and also the following. No palpitations or dizziness.  Physical Examination Filed Vitals:   05/06/14 0924  BP: 133/71  Pulse: 67   Filed Weights   05/06/14 0924  Weight: 172 lb (78.019 kg)    Overweight woman, appears comfortable at rest. HEENT: Conjunctiva and lids normal, oropharynx clear. Neck: Supple, no elevated JVP, possible right carotid bruit, no thyromegaly. Lungs: Clear to auscultation,  nonlabored breathing at rest. Cardiac: Regular rate and rhythm, no S3, 2/6 basal systolic murmur, no pericardial rub. Abdomen: Soft, nontender, no hepatomegaly, bowel sounds present, no guarding or rebound. Extremities: No pitting edema, distal pulses 2+. Skin: Warm and dry. Musculoskeletal: No kyphosis. Neuropsychiatric: Alert and oriented x3, affect grossly appropriate.   Problem List and Plan   Coronary artery disease Nonobstructive disease as outlined above by recent cardiac catheterization. Plan is to  continue medical therapy, diet and exercise regimen. Plan is to switch her from Lopressor to Toprol-XL 12.5 mg daily. I also asked her to increase Imdur to 30 mg daily if she is not noticing any improved angina symptoms in the next few weeks. Follow-up in the next 6 months.  Dyslipidemia She has a history of statin intolerance, continues on omega-3 supplements.    Jonelle SidleSamuel G. Kammy Klett, M.D., F.A.C.C.

## 2014-05-06 NOTE — Assessment & Plan Note (Signed)
She has a history of statin intolerance, continues on omega-3 supplements.

## 2014-11-04 ENCOUNTER — Ambulatory Visit: Payer: PRIVATE HEALTH INSURANCE | Admitting: Cardiology

## 2014-12-01 ENCOUNTER — Encounter: Payer: Self-pay | Admitting: Cardiology

## 2014-12-01 ENCOUNTER — Ambulatory Visit (INDEPENDENT_AMBULATORY_CARE_PROVIDER_SITE_OTHER): Payer: PRIVATE HEALTH INSURANCE | Admitting: Cardiology

## 2014-12-01 VITALS — BP 128/70 | HR 77 | Ht 65.0 in | Wt 171.0 lb

## 2014-12-01 DIAGNOSIS — I10 Essential (primary) hypertension: Secondary | ICD-10-CM | POA: Diagnosis not present

## 2014-12-01 DIAGNOSIS — I25119 Atherosclerotic heart disease of native coronary artery with unspecified angina pectoris: Secondary | ICD-10-CM

## 2014-12-01 NOTE — Progress Notes (Signed)
Cardiology Office Note  Date: 12/01/2014   ID: Suzanne Tapia, DOB 06-Jul-1955, MRN [id number]  PCP: Colon Branch, MD  Primary Cardiologist: Nona Dell, MD   Chief Complaint  Patient presents with  . Coronary Artery Disease    History of Present Illness: Suzanne Tapia is a 59 y.o. female last seen in December 2015. She presents for a routine visit today with her husband. Since our last encounter, she reports fewer episodes of angina. We reviewed her medications. 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 Does report angina symptoms with emotional stress.  Cardiac catheterization from December 2015 is outlined below demonstrating mild to moderate coronary atherosclerosis, plan to manage medically.  We discussed risk factor modification including control of diabetes mellitus and lipid status. She continues to follow with Dr. Virgina Organ. I recommended that she get a follow-up lipid panel. She reports prior statin intolerance, specifically mentioning Lipitor.   Past Medical History  Diagnosis Date  . Type 2 diabetes mellitus   . Mixed hyperlipidemia   . Essential hypertension   . CAD (coronary artery disease)     Mild to moderate by cardiac catheterization December 2015  . History of palpitations     Negative CardioNet monitor 2012    Current Outpatient Prescriptions  Medication Sig Dispense Refill  . aspirin EC 81 MG tablet Take 81 mg by mouth daily.      . enalapril (VASOTEC) 20 MG tablet Take 20 mg by mouth daily.      . insulin glargine (LANTUS) 100 UNIT/ML injection Inject 90 Units into the skin daily.     . insulin lispro (HUMALOG) 100 UNIT/ML injection Inject 75 Units into the skin daily.     . isosorbide mononitrate (IMDUR) 30 MG 24 hr tablet Take 0.5 tablets (15 mg total) by mouth daily. May take 1 tablet (30mg ) as needed for angina 30 tablet 6  . metoprolol succinate (TOPROL XL) 25 MG 24 hr tablet Take 0.5 tablets (12.5 mg total) by mouth daily. 45  tablet 3  . nitroGLYCERIN (NITROSTAT) 0.4 MG SL tablet Place 0.4 mg under the tongue every 5 (five) minutes as needed for chest pain.    . ranitidine (ZANTAC) 150 MG tablet Take 150 mg by mouth daily.       No current facility-administered medications for this visit.    Allergies:  Sulfa antibiotics   Social History: The patient  reports that she quit smoking about 14 years ago. Her smoking use included Cigarettes. She started smoking about 34 years ago. She has a 3 pack-year smoking history. She has never used smokeless tobacco. She reports that she does not drink alcohol or use illicit drugs.   ROS:  Please see the history of present illness. Otherwise, complete review of systems is positive for none.  All other systems are reviewed and negative.   Physical Exam: VS:  BP 128/70 mmHg  Pulse 77  Ht 5\' 5"  (1.651 m)  Wt 171 lb (77.565 kg)  BMI 28.46 kg/m2  SpO2 96%, BMI Body mass index is 28.46 kg/(m^2).  Wt Readings from Last 3 Encounters:  12/01/14 171 lb (77.565 kg)  05/06/14 172 lb (78.019 kg)  04/28/14 170 lb (77.111 kg)    Overweight woman, appears comfortable at rest. HEENT: Conjunctiva and lids normal, oropharynx clear. Neck: Supple, no elevated JVP, possible right carotid bruit, no thyromegaly. Lungs: Clear to auscultation, nonlabored breathing at rest. Cardiac: Regular rate and rhythm, no S3, 2/6 basal systolic  murmur, no pericardial rub. Abdomen: Soft, nontender, no hepatomegaly, bowel sounds present, no guarding or rebound. Extremities: No pitting edema, distal pulses 2+. Skin: Warm and dry. Musculoskeletal: No kyphosis. Neuropsychiatric: Alert and oriented x3, affect grossly appropriate.  ECG: ECG is not ordered today.   Recent Labwork: 04/28/2014: BUN 16; Creatinine, Ser 0.80; Hemoglobin 14.3; Platelets 441*; Potassium 4.3; Sodium 141   Other Studies Reviewed Today:  Cardiac catheterization 04/28/2014: Hemodynamics: AO 118/52 LV 118/7  Coronary  angiography: Coronary dominance: right  Left mainstem: The left main stem is short. The vessel was calcified. The left main is widely patent without obstruction.  Left anterior descending (LAD): The LAD is severely calcified, especially in the midportion of the vessel. The proximal LAD is patent with scattered 20% stenoses. The diagonal branches are patent with mild ostial stenosis. The mid LAD has 50% stenosis. The LAD reaches the LV apex. There is no high-grade obstructive disease identified throughout the LAD distribution.  Left circumflex (LCx): The left circumflex is patent. There are 3 obtuse marginal branches present. The mid AV circumflex just after the second obtuse marginal has a 50-60% stenosis. The OM branches are small and diffusely diseased. The third OM is the largest of the obtuse marginal branches.  Right coronary artery (RCA): The RCA is diffusely diseased through its midportion. There is diffuse 30-40% stenosis present. The PDA and PLA branches are patent without significant stenosis. There is diffuse calcification of the proximal and mid RCA.  Left ventriculography: Left ventricular systolic function is normal, LVEF is estimated at 65%, there is no significant mitral regurgitation   Estimated Blood Loss: Minimal  Final Conclusions:  1. Mild to moderate diffuse coronary artery disease  2. Normal LV systolic function  Assessment and Plan:  1. Mild to moderate coronary atherosclerosis, plan to continue medical therapy and observation. We discussed diet and exercise, also getting follow-up lipid panel with her primary care provider.  2. Essential hypertension, blood pressure is normal today.  Current medicines were reviewed with the patient today.  Disposition: FU with me in 6 months.   Signed, Jonelle Sidle, MD, [facility] 12/01/2014 11:49 AM    [facility] at [facility] [address] [place], [place], [place] [address] Phone: [phone]; Fax: [phone]

## 2014-12-01 NOTE — Patient Instructions (Signed)
Your physician recommends that you continue on your current medications as directed. Please refer to the Current Medication list given to you today. Your physician recommends that you schedule a follow-up appointment in: 6 months. You will receive a reminder letter in the mail in about 4 months reminding you to call and schedule your appointment. If you don't receive this letter, please contact our office. 

## 2015-04-20 ENCOUNTER — Other Ambulatory Visit: Payer: Self-pay | Admitting: *Deleted

## 2015-04-20 MED ORDER — METOPROLOL SUCCINATE ER 25 MG PO TB24: 12.5000 mg | ORAL_TABLET | Freq: Every day | ORAL | Status: DC

## 2015-05-27 ENCOUNTER — Ambulatory Visit (INDEPENDENT_AMBULATORY_CARE_PROVIDER_SITE_OTHER): Payer: PRIVATE HEALTH INSURANCE | Admitting: Cardiology

## 2015-05-27 ENCOUNTER — Encounter: Payer: Self-pay | Admitting: Cardiology

## 2015-05-27 VITALS — BP 111/63 | HR 78 | Ht 65.0 in | Wt 177.0 lb

## 2015-05-27 DIAGNOSIS — R002 Palpitations: Secondary | ICD-10-CM | POA: Diagnosis not present

## 2015-05-27 DIAGNOSIS — I10 Essential (primary) hypertension: Secondary | ICD-10-CM | POA: Diagnosis not present

## 2015-05-27 DIAGNOSIS — I25119 Atherosclerotic heart disease of native coronary artery with unspecified angina pectoris: Secondary | ICD-10-CM | POA: Diagnosis not present

## 2015-05-27 MED ORDER — NITROGLYCERIN 0.4 MG SL SUBL: 0.4000 mg | SUBLINGUAL_TABLET | SUBLINGUAL | Status: DC | PRN

## 2015-05-27 NOTE — Progress Notes (Signed)
Cardiology Office Note  Date: 05/27/2015   ID: Suzanne Tapia, DOB 01-Sep-1955, MRN [id number]  PCP: Colon Branch, MD  Primary Cardiologist: Nona Dell, MD   Chief Complaint  Patient presents with  . Coronary Artery Disease    History of Present Illness: Suzanne Tapia is a 60 y.o. female last seen in August 2016. She is here today with her husband for a follow-up visit. She tells me that she has continued to have intermittent episodes of chest discomfort. We have been treating her for angina in the setting of mild to moderate nonobstructive CAD by previous cardiac catheterization as outlined below. She reports compliance with her medications. She does bring up the fact today that she has been under a significant amount of stress at work. She is a Child psychotherapist, works in a [employer]. She is starting to feel like stress and anxiety are also playing a significant role in her symptoms. She plans to discuss this further with Dr. Virgina Organ.  I reviewed her cardiac medications. Current regimen includes aspirin, enalapril, Imdur, Toprol-XL, and as needed nitroglycerin.  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  Past Medical History  Diagnosis Date  . Type 2 diabetes mellitus (HCC)   . Mixed hyperlipidemia   . Essential hypertension   . CAD (coronary artery disease)     Mild to moderate by cardiac catheterization December 2015  . History of palpitations     Negative CardioNet monitor 2012    Current Outpatient Prescriptions  Medication Sig Dispense Refill  . aspirin EC 81 MG tablet Take 81 mg by mouth daily.      . enalapril (VASOTEC) 20 MG tablet Take 20 mg by mouth daily.      . Insulin Glargine (TOUJEO SOLOSTAR [place]) Inject 90 Units into the skin every morning.    . insulin lispro (HUMALOG) 100 UNIT/ML injection Inject 88 Units into the skin daily.     . isosorbide mononitrate (IMDUR) 30 MG 24 hr tablet Take 0.5  tablets (15 mg total) by mouth daily. May take 1 tablet ( ) as needed for angina 30 tablet 6  . metoprolol succinate (TOPROL XL) 25 MG 24 hr tablet Take 0.5 tablets (12.5 mg total) by mouth daily. 45 tablet 3  . nitroGLYCERIN (NITROSTAT) 0.4 MG SL tablet Place 1 tablet (0.4 mg total) under the tongue every 5 (five) minutes as needed for chest pain. Up to 3 doses. If no relief after 3 rd dose, proceed to the ED for an evaluation 25 tablet 3  . ranitidine (ZANTAC) 150 MG tablet Take 150 mg by mouth 2 (two) times daily.      No current facility-administered medications for this visit.   Allergies:  Sulfa antibiotics   Social History: The patient  reports that she quit smoking about 15 years ago. Her smoking use included Cigarettes. She started smoking about 34 years ago. She has a 3 pack-year smoking history. She has never used smokeless tobacco. She reports that she does not drink alcohol or use illicit drugs.   ROS:  Please see the history of present illness. Otherwise, complete review of systems is positive for situational stress and anxiety.  All other systems are reviewed and negative.   Physical Exam: VS:  BP 111/63 mmHg  Pulse 78  Ht  (1.651 m)  Wt 177 lb (80.287 kg)  BMI 29.45 kg/m2  SpO2 98%, BMI Body mass index is  29.45 kg/(m^2).  Wt Readings from Last 3 Encounters:  05/27/15 177 lb (80.287 kg)  12/01/14 171 lb (77.565 kg)  05/06/14 172 lb (78.019 kg)    Overweight woman, appears comfortable at rest. HEENT: Conjunctiva and lids normal, oropharynx clear. Neck: Supple, no elevated JVP, possible right carotid bruit, no thyromegaly. Lungs: Clear to auscultation, nonlabored breathing at rest. Cardiac: Regular rate and rhythm, no S3, 2/6 basal systolic murmur, no pericardial rub. Abdomen: Soft, nontender, no hepatomegaly, bowel sounds present, no guarding or rebound. Extremities: No pitting edema, distal pulses 2+.  ECG: ECG is ordered today.  Other Studies Reviewed  Today:  Cardiac catheterization 04/28/2014: Hemodynamics: AO 118/52 LV 118/7  Coronary angiography: Coronary dominance: right  Left mainstem: The left main stem is short. The vessel was calcified. The left main is widely patent without obstruction.  Left anterior descending (LAD): The LAD is severely calcified, especially in the midportion of the vessel. The proximal LAD is patent with scattered 20% stenoses. The diagonal branches are patent with mild ostial stenosis. The mid LAD has 50% stenosis. The LAD reaches the LV apex. There is no high-grade obstructive disease identified throughout the LAD distribution.  Left circumflex (LCx): The left circumflex is patent. There are 3 obtuse marginal branches present. The mid AV circumflex just after the second obtuse marginal has a 50-60% stenosis. The OM branches are small and diffusely diseased. The third OM is the largest of the obtuse marginal branches.  Right coronary artery (RCA): The RCA is diffusely diseased through its midportion. There is diffuse 30-40% stenosis present. The PDA and PLA branches are patent without significant stenosis. There is diffuse calcification of the proximal and mid RCA.  Left ventriculography: Left ventricular systolic function is normal, LVEF is estimated at 65%, there is no significant mitral regurgitation   Estimated Blood Loss: Minimal  Final Conclusions:  1. Mild to moderate diffuse coronary artery disease  2. Normal LV systolic function  Assessment and Plan:  1. Mild to moderate CAD by cardiac catheterization in December 2015. Our plan is to continue medical therapy with recurring angina symptoms, although it may be that situational stress and anxiety are also playing a role. She does plan to discuss this issue with Dr. Virgina Organ at pending follow-up visit. No changes made in current cardiac regimen.  2. Essential hypertension, blood pressure control is good today.  3. History of palpitations, this  has been well controlled on beta blocker.  Current medicines were reviewed with the patient today.   Orders Placed This Encounter  Procedures  . EKG 12-Lead    Disposition: FU with me in 6 months.   Signed, Jonelle Sidle, MD, [facility] 05/27/2015 3:40 PM    [facility] at [facility] [address] [place], [place], [place] [address] Phone: [phone]; Fax: [phone]

## 2015-05-27 NOTE — Patient Instructions (Signed)
Your physician recommends that you continue on your current medications as directed. Please refer to the Current Medication list given to you today. Your physician recommends that you schedule a follow-up appointment in: 6 months. You will receive a reminder letter in the mail in about 4 months reminding you to call and schedule your appointment. If you don't receive this letter, please contact our office. 

## 2015-07-20 ENCOUNTER — Other Ambulatory Visit: Payer: Self-pay | Admitting: *Deleted

## 2015-07-20 MED ORDER — ISOSORBIDE MONONITRATE ER 30 MG PO TB24: 15.0000 mg | ORAL_TABLET | Freq: Every day | ORAL | Status: DC

## 2016-08-26 NOTE — Progress Notes (Signed)
Cardiology Office Note  Date: 08/29/2016   ID: CANDID BOVEY, DOB 1956-01-14, MRN [id number]  PCP: Josue Hector, MD  Primary Cardiologist: Nona Dell, MD   Chief Complaint  Patient presents with  . Coronary Artery Disease    History of Present Illness: Suzanne Tapia is a 61 y.o. female last seen in January 2017. She is here today with her husband for a follow-up visit. Reports intermittent angina symptoms in the setting of emotional stress mainly. 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 She is not exercising, we discussed a walking plan.  I went over her medications. Current cardiac regimen includes aspirin, Vasotec, and Imdur which she is taking at 30 mg daily.  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  She had recent lab work done per Dr. Lysbeth Galas.  Past Medical History:  Diagnosis Date  . CAD (coronary artery disease)    Mild to moderate by cardiac catheterization December 2015  . Essential hypertension   . History of palpitations    Negative CardioNet monitor 2012  . Mixed hyperlipidemia   . Type 2 diabetes mellitus (HCC)     Past Surgical History:  Procedure Laterality Date  . BILATERAL SALPINGOOPHORECTOMY    . BREAST LUMPECTOMY Bilateral   . CARPAL TUNNEL RELEASE Left   . LEFT HEART CATHETERIZATION WITH CORONARY ANGIOGRAM N/A 04/28/2014   Procedure: LEFT HEART CATHETERIZATION WITH CORONARY ANGIOGRAM;  Surgeon: Micheline Chapman, MD;  Location: [facility] CATH LAB;  Service: Cardiovascular;  Laterality: N/A;    Current Outpatient Prescriptions  Medication Sig Dispense Refill  . aspirin EC 81 MG tablet Take 81 mg by mouth daily.      . enalapril (VASOTEC) 20 MG tablet Take 20 mg by mouth daily.      . Insulin Glargine (TOUJEO SOLOSTAR [place]) Inject 90 Units into the skin every morning.    . insulin lispro (HUMALOG) 100 UNIT/ML injection Inject 88 Units into the skin daily.     . isosorbide  mononitrate (IMDUR) 30 MG 24 hr tablet Take 0.5 tablets (15 mg total) by mouth daily. May take 1 tablet ( ) as needed for angina 30 tablet 6  . loratadine (CLARITIN) 10 MG tablet Take 10 mg by mouth daily.    . nitroGLYCERIN (NITROSTAT) 0.4 MG SL tablet Place 1 tablet (0.4 mg total) under the tongue every 5 (five) minutes as needed for chest pain. Up to 3 doses. If no relief after 3 rd dose, proceed to the ED for an evaluation 25 tablet 3  . ranitidine (ZANTAC) 150 MG tablet Take 150 mg by mouth 2 (two) times daily.      No current facility-administered medications for this visit.    Allergies:  Sulfa antibiotics   Social History: The patient  reports that she quit smoking about 16 years ago. Her smoking use included Cigarettes. She started smoking about 36 years ago. She has a 3.00 pack-year smoking history. She has never used smokeless tobacco. She reports that she does not drink alcohol or use drugs.   ROS:  Please see the history of present illness. Otherwise, complete review of systems is positive for none.  All other systems are reviewed and negative.   Physical Exam: VS:  BP 125/67   Pulse 83   Ht  (1.651 m)   Wt 174 lb (78.9 kg)   SpO2 98%   BMI 28.96 kg/m , BMI Body mass  index is 28.96 kg/m.  Wt Readings from Last 3 Encounters:  08/29/16 174 lb (78.9 kg)  05/27/15 177 lb (80.3 kg)  12/01/14 171 lb (77.6 kg)    Overweight woman, appears comfortable at rest. HEENT: Conjunctiva and lids normal, oropharynx clear. Neck: Supple, no elevated JVP, possible right carotid bruit, no thyromegaly. Lungs: Clear to auscultation, nonlabored breathing at rest. Cardiac: Regular rate and rhythm, no S3, 2/6 basal systolic murmur, no pericardial rub. Abdomen: Soft, nontender, no hepatomegaly, bowel sounds present, no guarding or rebound. Extremities: No pitting edema, distal pulses 2+. Skin: Warm and dry. Musculoskeletal: No kyphosis Neuropsychiatric: Alert and oriented 3, affect  appropriate.  ECG: I personally reviewed the tracing from 05/27/2015 which showed normal sinus rhythm with left bundle branch block.  Recent Labwork:  February 2018: Hemoglobin 13.6, platelets 449, BUN 19, creatinine 0.91, potassium 4.5, AST 29, ALT 40, hemoglobin A1c 7.3, LDL 132, total cholesterol 220, triglycerides 275, HDL 33  Other Studies Reviewed Today:  Cardiac catheterization 04/28/2014: Hemodynamics: AO 118/52 LV 118/7  Coronary angiography: Coronary dominance: right  Left mainstem: The left main stem is short. The vessel was calcified. The left main is widely patent without obstruction.  Left anterior descending (LAD): The LAD is severely calcified, especially in the midportion of the vessel. The proximal LAD is patent with scattered 20% stenoses. The diagonal branches are patent with mild ostial stenosis. The mid LAD has 50% stenosis. The LAD reaches the LV apex. There is no high-grade obstructive disease identified throughout the LAD distribution.  Left circumflex (LCx): The left circumflex is patent. There are 3 obtuse marginal branches present. The mid AV circumflex just after the second obtuse marginal has a 50-60% stenosis. The OM branches are small and diffusely diseased. The third OM is the largest of the obtuse marginal branches.  Right coronary artery (RCA): The RCA is diffusely diseased through its midportion. There is diffuse 30-40% stenosis present. The PDA and PLA branches are patent without significant stenosis. There is diffuse calcification of the proximal and mid RCA.  Left ventriculography: Left ventricular systolic function is normal, LVEF is estimated at 65%, there is no significant mitral regurgitation   Estimated Blood Loss: Minimal  Final Conclusions:  1. Mild to moderate diffuse coronary artery disease  2. Normal LV systolic function  Assessment and Plan:  1. CAD with intermittent angina symptoms. We discussed a walking plan for  exercise, will continue with medical therapy. ECG reviewed and stable.  2. Essential hypertension, blood pressure well controlled today. She does state it is elevated at times. I asked her to keep an eye on the trend at home.  3. Type 2 diabetes mellitus, on insulin. She follows with Dr. Lysbeth Galas. Recent hemoglobin A1c 7.3.  4. History of statin intolerance. Recent LDL 132.  Current medicines were reviewed with the patient today.   Orders Placed This Encounter  Procedures  . EKG 12-Lead    Disposition: Follow-up in 6 months.  Signed, Jonelle Sidle, MD, [facility] 08/29/2016 1:11 PM    [facility] at [facility] [address] [place], [place], [place] [address] Phone: [phone]; Fax: [phone]

## 2016-08-29 ENCOUNTER — Ambulatory Visit (INDEPENDENT_AMBULATORY_CARE_PROVIDER_SITE_OTHER): Payer: PRIVATE HEALTH INSURANCE | Admitting: Cardiology

## 2016-08-29 ENCOUNTER — Encounter: Payer: Self-pay | Admitting: Cardiology

## 2016-08-29 VITALS — BP 125/67 | HR 83 | Ht 65.0 in | Wt 174.0 lb

## 2016-08-29 DIAGNOSIS — I10 Essential (primary) hypertension: Secondary | ICD-10-CM

## 2016-08-29 DIAGNOSIS — Z789 Other specified health status: Secondary | ICD-10-CM

## 2016-08-29 DIAGNOSIS — E119 Type 2 diabetes mellitus without complications: Secondary | ICD-10-CM

## 2016-08-29 DIAGNOSIS — I25119 Atherosclerotic heart disease of native coronary artery with unspecified angina pectoris: Secondary | ICD-10-CM | POA: Diagnosis not present

## 2016-08-29 DIAGNOSIS — Z794 Long term (current) use of insulin: Secondary | ICD-10-CM

## 2016-08-29 NOTE — Patient Instructions (Signed)

## 2016-09-02 ENCOUNTER — Other Ambulatory Visit: Payer: Self-pay | Admitting: Cardiology

## 2016-12-19 ENCOUNTER — Other Ambulatory Visit: Payer: Self-pay | Admitting: Cardiology

## 2017-03-03 ENCOUNTER — Ambulatory Visit (INDEPENDENT_AMBULATORY_CARE_PROVIDER_SITE_OTHER): Payer: PRIVATE HEALTH INSURANCE | Admitting: Cardiology

## 2017-03-03 ENCOUNTER — Encounter: Payer: Self-pay | Admitting: Cardiology

## 2017-03-03 VITALS — BP 130/70 | HR 84 | Ht 65.0 in | Wt 179.6 lb

## 2017-03-03 DIAGNOSIS — Z794 Long term (current) use of insulin: Secondary | ICD-10-CM

## 2017-03-03 DIAGNOSIS — E119 Type 2 diabetes mellitus without complications: Secondary | ICD-10-CM | POA: Diagnosis not present

## 2017-03-03 DIAGNOSIS — E782 Mixed hyperlipidemia: Secondary | ICD-10-CM

## 2017-03-03 DIAGNOSIS — I25119 Atherosclerotic heart disease of native coronary artery with unspecified angina pectoris: Secondary | ICD-10-CM

## 2017-03-03 DIAGNOSIS — I10 Essential (primary) hypertension: Secondary | ICD-10-CM | POA: Diagnosis not present

## 2017-03-03 NOTE — Progress Notes (Signed)
Cardiology Office Note  Date: 03/03/2017   ID: Suzanne Tapia, DOB 04-26-1956, MRN [id number]  PCP: Joette Catching, MD  Primary Cardiologist: Nona Dell, MD   Chief Complaint  Patient presents with  . Coronary Artery Disease    History of Present Illness: Suzanne Tapia is a 61 y.o. female last seen in April.  She presents today with her husband for a follow-up visit.  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  Also reports continued episodes of chest pain which she thinks may be more situationally related to stress although the possibility of microvascular angina with type 2 diabetes mellitus is still possible.  We have continued with medical therapy.  She is interested in trying to wean down her Imdur to see if it makes a difference.  She has also been started on low-dose Crestor by her PCP, recent LDL 111.  I reviewed her recent lab work from September as outlined below.  Current cardiac regimen includes aspirin, Vasotec, Imdur, and Crestor.  Past Medical History:  Diagnosis Date  . CAD (coronary artery disease)    Mild to moderate by cardiac catheterization December 2015  . Essential hypertension   . History of palpitations    Negative CardioNet monitor 2012  . Mixed hyperlipidemia   . Type 2 diabetes mellitus (HCC)     Past Surgical History:  Procedure Laterality Date  . BILATERAL SALPINGOOPHORECTOMY    . BREAST LUMPECTOMY Bilateral   . CARPAL TUNNEL RELEASE Left   . LEFT HEART CATHETERIZATION WITH CORONARY ANGIOGRAM N/A 04/28/2014   Procedure: LEFT HEART CATHETERIZATION WITH CORONARY ANGIOGRAM;  Surgeon: Micheline Chapman, MD;  Location: [facility] CATH LAB;  Service: Cardiovascular;  Laterality: N/A;    Current Outpatient Prescriptions  Medication Sig Dispense Refill  . aspirin EC 81 MG tablet Take 81 mg by mouth daily.      . enalapril (VASOTEC) 20 MG tablet Take 20 mg by mouth daily.      . insulin glargine (LANTUS) 100 UNIT/ML injection Inject 98 Units  into the skin at bedtime.    . insulin lispro (HUMALOG) 100 UNIT/ML injection Inject 48 Units into the skin daily.     . isosorbide mononitrate (IMDUR) 30 MG 24 hr tablet TAKE 1/2 TO 1 TABLET DAILY AS NEEDED 90 tablet 3  . loratadine (CLARITIN) 10 MG tablet Take 10 mg by mouth daily.    . nitroGLYCERIN (NITROSTAT) 0.4 MG SL tablet Place 1 tablet (0.4 mg total) under the tongue every 5 (five) minutes as needed for chest pain. Up to 3 doses. If no relief after 3 rd dose, proceed to the ED for an evaluation 25 tablet 3  . ranitidine (ZANTAC) 150 MG tablet Take 150 mg by mouth 2 (two) times daily.     . rosuvastatin (CRESTOR) 5 MG tablet Take 5 mg by mouth daily.     No current facility-administered medications for this visit.    Allergies:  Sulfa antibiotics   Social History: The patient  reports that she quit smoking about 16 years ago. Her smoking use included Cigarettes. She started smoking about 36 years ago. She has a 3.00 pack-year smoking history. She has never used smokeless tobacco. She reports that she does not drink alcohol or use drugs.   ROS:  Please see the history of present illness. Otherwise, complete review of systems is positive for none.  All other systems are reviewed and negative.   Physical Exam: VS:  BP 130/70   Pulse  84   Ht 5\' 5"  (1.651 m)   Wt 179 lb 9.6 oz (81.5 kg)   SpO2 96% Comment: on room air  BMI 29.89 kg/m , BMI Body mass index is 29.89 kg/m.  Wt Readings from Last 3 Encounters:  03/03/17 179 lb 9.6 oz (81.5 kg)  08/29/16 174 lb (78.9 kg)  05/27/15 177 lb (80.3 kg)    General: Overweight woman, appears comfortable at rest. HEENT: Conjunctiva and lids normal, oropharynx clear. Neck: Supple, no elevated JVP or carotid bruits, no thyromegaly. Lungs: Clear to auscultation, nonlabored breathing at rest. Cardiac: Regular rate and rhythm, no S3, 2/6 systolic murmur, no pericardial rub. Abdomen: Soft, nontender, bowel sounds present, no guarding or  rebound. Extremities: No pitting edema, distal pulses 2+. Skin: Warm and dry. Musculoskeletal: No kyphosis. Neuropsychiatric: Alert and oriented x3, affect grossly appropriate.  ECG: I personally reviewed the tracing from 08/29/2016 which showed sinus rhythm with left bundle branch block.  Recent Labwork:  September 2018: BUN 17, creatinine 0.9, potassium 4.1, AST 36, ALT 37, hemoglobin A1c 7.2, cholesterol 190, triglycerides 237, HDL 32, LDL 111, hemoglobin 13.8, platelets 410  Other Studies Reviewed Today:  Cardiac catheterization 04/28/2014: Hemodynamics: AO 118/52 LV 118/7  Coronary angiography: Coronary dominance: right  Left mainstem: The left main stem is short. The vessel was calcified. The left main is widely patent without obstruction.  Left anterior descending (LAD): The LAD is severely calcified, especially in the midportion of the vessel. The proximal LAD is patent with scattered 20% stenoses. The diagonal branches are patent with mild ostial stenosis. The mid LAD has 50% stenosis. The LAD reaches the LV apex. There is no high-grade obstructive disease identified throughout the LAD distribution.  Left circumflex (LCx): The left circumflex is patent. There are 3 obtuse marginal branches present. The mid AV circumflex just after the second obtuse marginal has a 50-60% stenosis. The OM branches are small and diffusely diseased. The third OM is the largest of the obtuse marginal branches.  Right coronary artery (RCA): The RCA is diffusely diseased through its midportion. There is diffuse 30-40% stenosis present. The PDA and PLA branches are patent without significant stenosis. There is diffuse calcification of the proximal and mid RCA.  Left ventriculography: Left ventricular systolic function is normal, LVEF is estimated at 65%, there is no significant mitral regurgitation   Estimated Blood Loss: Minimal  Final Conclusions:  1. Mild to moderate diffuse coronary  artery disease  2. Normal LV systolic function  Assessment and Plan:  1.  Non-obstructive CAD with possible microvascular angina, although stress may be contributing as well.  Continue with medical therapy.  She would like to try to wean down Imdur to see if it makes any difference in symptom control.  We did talk about considering Ranexa but she really did not want to make any other changes at this point.  2.  Essential hypertension, blood pressure control is adequate today.  3.  Hyperlipidemia, recent LDL 111.  She has started on low-dose Crestor and tolerating it so far.  He does have a previous history of statin intolerance.  4.  Type 2 diabetes mellitus, continues on insulin with follow-up with Dr. Lysbeth GalasNyland.  Hemoglobin A1c 7.2.  Current medicines were reviewed with the patient today.  Disposition: Follow-up in 6 months.  Signed, Jonelle SidleSamuel G. Marshell Dilauro, MD, [facility] 03/03/2017 2:46 PM    [facility] [address], [place], [place] [address] Phone: [phone]; Fax: [phone]

## 2017-03-03 NOTE — Patient Instructions (Signed)

## 2018-03-27 ENCOUNTER — Ambulatory Visit: Payer: PRIVATE HEALTH INSURANCE | Admitting: Cardiology

## 2018-03-27 ENCOUNTER — Telehealth: Payer: Self-pay | Admitting: Cardiology

## 2018-03-27 ENCOUNTER — Encounter: Payer: Self-pay | Admitting: Cardiology

## 2018-03-27 VITALS — BP 142/68 | HR 92 | Ht 65.0 in | Wt 176.2 lb

## 2018-03-27 DIAGNOSIS — I25119 Atherosclerotic heart disease of native coronary artery with unspecified angina pectoris: Secondary | ICD-10-CM | POA: Diagnosis not present

## 2018-03-27 DIAGNOSIS — Z794 Long term (current) use of insulin: Secondary | ICD-10-CM

## 2018-03-27 DIAGNOSIS — E782 Mixed hyperlipidemia: Secondary | ICD-10-CM

## 2018-03-27 DIAGNOSIS — I10 Essential (primary) hypertension: Secondary | ICD-10-CM | POA: Diagnosis not present

## 2018-03-27 DIAGNOSIS — E119 Type 2 diabetes mellitus without complications: Secondary | ICD-10-CM

## 2018-03-27 MED ORDER — ISOSORBIDE MONONITRATE ER 30 MG PO TB24: 30.0000 mg | ORAL_TABLET | ORAL | 3 refills | Status: DC

## 2018-03-27 MED ORDER — NITROGLYCERIN 0.4 MG SL SUBL: 0.4000 mg | SUBLINGUAL_TABLET | SUBLINGUAL | 3 refills | Status: DC | PRN

## 2018-03-27 NOTE — Patient Instructions (Addendum)
Medication Instructions:   Your physician has recommended you make the following change in your medication:   Start imdur 30 mg by mouth daily in the morning.  Continue all other medications the same.  Labwork:  NONE  Testing/Procedures:  NONE  Follow-Up:  Your physician recommends that you schedule a follow-up appointment in: 3 months.  Any Other Special Instructions Will Be Listed Below (If Applicable).  If you need a refill on your cardiac medications before your next appointment, please call your pharmacy.

## 2018-03-27 NOTE — Telephone Encounter (Addendum)
Patient c/o chest pain last night rated 8/10 that seemed to get worse when laying down. Used nitroglycerin x's 1 and felt relief. Patient said that she's had this chest pain for some time now but it seems to have gotten worse and more frequent. Patient said she also has chest pain when walking to the car. Patient said she weaned herself off imdur > 6 months ago, and that she was told to do this at her last office visit since it wasn't helping her symptoms. No active chest pain. No c/o sob or dizziness. Patient advised that she should keep the appointment given to her today at 11:20 am. Verbalized understanding.

## 2018-03-27 NOTE — Progress Notes (Signed)
Cardiology Office Note  Date: 03/27/2018   ID: Suzanne Tapia, DOB 11/12/1955, MRN [id number]  PCP: Brett Soza Jester, DO  Primary Cardiologist: Nona Dell, MD   Chief Complaint  Patient presents with  . Chest Pain    History of Present Illness: Suzanne Tapia is a 62 y.o. female last seen in November 2018.  She presents overdue for follow-up, called the office reporting recent chest pain and was added onto my schedule.  She came off Imdur since our last visit.  States that she still has had intermittent episodes of chest pain.  It is not predictable based on level of activity however, can happen walking across the room, but not necessarily when doing more strenuous activity such as walking in the mountains or upstairs.  She had an episode last night and took a nitroglycerin for the first time, states that this was helpful.  She does not report any obvious reflux symptoms.  Otherwise states that she has been taking her medications regularly.  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  Today we discussed her previous cardiac catheterization, plan to continue medical therapy for now with re-addition of long-acting nitrates.  Past Medical History:  Diagnosis Date  . CAD (coronary artery disease)    Mild to moderate by cardiac catheterization December 2015  . Essential hypertension   . History of palpitations    Negative CardioNet monitor 2012  . Mixed hyperlipidemia   . Type 2 diabetes mellitus (HCC)     Past Surgical History:  Procedure Laterality Date  . BILATERAL SALPINGOOPHORECTOMY    . BREAST LUMPECTOMY Bilateral   . CARPAL TUNNEL RELEASE Left   . LEFT HEART CATHETERIZATION WITH CORONARY ANGIOGRAM N/A 04/28/2014   Procedure: LEFT HEART CATHETERIZATION WITH CORONARY ANGIOGRAM;  Surgeon: Micheline Chapman, MD;  Location: [facility] CATH LAB;  Service: Cardiovascular;  Laterality: N/A;    Current Outpatient Medications   Medication Sig Dispense Refill  . aspirin EC 81 MG tablet Take 81 mg by mouth daily.      Marland Kitchen buPROPion (WELLBUTRIN XL) 150 MG 24 hr tablet Take 150 mg by mouth every other day.    . enalapril (VASOTEC) 20 MG tablet Take 20 mg by mouth daily.      . fenofibrate (TRICOR) 145 MG tablet Take 145 mg by mouth daily.    . Insulin Glargine (BASAGLAR KWIKPEN) 100 UNIT/ML SOPN Inject 100 Units into the skin every morning.    . insulin lispro (HUMALOG) 100 UNIT/ML injection Inject 50 Units into the skin 2 (two) times daily.     Marland Kitchen loratadine (CLARITIN) 10 MG tablet Take 10 mg by mouth daily.    . nitroGLYCERIN (NITROSTAT) 0.4 MG SL tablet Place 1 tablet (0.4 mg total) under the tongue every 5 (five) minutes x 3 doses as needed for chest pain (If no relief after 3rd dose, proceed to the ED for an evaluation). U 25 tablet 3  . omeprazole (PRILOSEC) 20 MG capsule Take 20 mg by mouth daily.    . rosuvastatin (CRESTOR) 5 MG tablet Take 5 mg by mouth daily.    . isosorbide mononitrate (IMDUR) 30 MG 24 hr tablet Take 1 tablet (30 mg total) by mouth every morning. 90 tablet 3   No current facility-administered medications for this visit.    Allergies:  Sulfa antibiotics   Social History: The patient  reports that she quit smoking about 17 years ago. Her  smoking use included cigarettes. She started smoking about 37 years ago. She has a 3.00 pack-year smoking history. She has never used smokeless tobacco. She reports that she does not drink alcohol or use drugs.   ROS:  Please see the history of present illness. Otherwise, complete review of systems is positive for none.  All other systems are reviewed and negative.   Physical Exam: VS:  BP (!) 142/68   Pulse 92   Ht 5\' 5"  (1.651 m)   Wt 176 lb 3.2 oz (79.9 kg)   SpO2 97%   BMI 29.32 kg/m , BMI Body mass index is 29.32 kg/m.  Wt Readings from Last 3 Encounters:  03/27/18 176 lb 3.2 oz (79.9 kg)  03/03/17 179 lb 9.6 oz (81.5 kg)  08/29/16 174 lb (78.9  kg)    General: Patient appears comfortable at rest. HEENT: Conjunctiva and lids normal, oropharynx clear. Neck: Supple, no elevated JVP or carotid bruits, no thyromegaly. Lungs: Clear to auscultation, nonlabored breathing at rest. Cardiac: Regular rate and rhythm, no S3, 2/6 systolic murmur. Abdomen: Soft, nontender, bowel sounds present, no guarding or rebound. Extremities: No pitting edema, distal pulses 2+. Skin: Warm and dry. Musculoskeletal: No kyphosis. Neuropsychiatric: Alert and oriented x3, affect grossly appropriate.  ECG: I personally reviewed the tracing from 08/02/2016 which showed sinus rhythm with left bundle branch block.  Recent Labwork:  September 2018: BUN 17, creatinine 0.9, potassium 4.1, AST 36, ALT 37, hemoglobin A1c 7.2, cholesterol 190, triglycerides 237, HDL 32, LDL 111, hemoglobin 13.8, platelets 410  Other Studies Reviewed Today:  Cardiac catheterization 04/28/2014: Hemodynamics: AO 118/52 LV 118/7  Coronary angiography: Coronary dominance: right  Left mainstem: The left main stem is short. The vessel was calcified. The left main is widely patent without obstruction.  Left anterior descending (LAD): The LAD is severely calcified, especially in the midportion of the vessel. The proximal LAD is patent with scattered 20% stenoses. The diagonal branches are patent with mild ostial stenosis. The mid LAD has 50% stenosis. The LAD reaches the LV apex. There is no high-grade obstructive disease identified throughout the LAD distribution.  Left circumflex (LCx): The left circumflex is patent. There are 3 obtuse marginal branches present. The mid AV circumflex just after the second obtuse marginal has a 50-60% stenosis. The OM branches are small and diffusely diseased. The third OM is the largest of the obtuse marginal branches.  Right coronary artery (RCA): The RCA is diffusely diseased through its midportion. There is diffuse 30-40% stenosis present. The PDA  and PLA branches are patent without significant stenosis. There is diffuse calcification of the proximal and mid RCA.  Left ventriculography: Left ventricular systolic function is normal, LVEF is estimated at 65%, there is no significant mitral regurgitation   Estimated Blood Loss: Minimal  Final Conclusions:  1. Mild to moderate diffuse coronary artery disease  2. Normal LV systolic function  Assessment and Plan:  1.  Recurrent chest pain, nitrate responsive last night.  She has a history of mild to moderate coronary artery disease based on cardiac catheterization in 2015.  We have been managing her medically with suspicion of microvascular angina at least as a component, possibly also GI overlay.  Her ECG is stable.  Plan is to add back Imdur at 30 mg daily and continue present regimen.  Office follow-up arranged.  Certainly, if symptoms escalate on medical therapy we may need to reevaluate coronary anatomy.  2.  Essential hypertension, continue with baseline regimen.  3.  Mixed hyperlipidemia, on Crestor.  Lipid panel in March of this year showed LDL 70.  4.  Type 2 diabetes mellitus, following with Dr. Lysbeth GalasNyland.  Hemoglobin A1c was 6.7 in March.  Current medicines were reviewed with the patient today.   Orders Placed This Encounter  Procedures  . EKG 12-Lead    Disposition: Follow-up in 3 months.  Signed, Jonelle SidleSamuel G. Foye Haggart, MD, [facility] 03/27/2018 11:34 AM    [facility] [address], [place], [place] [address] Phone: [phone]; Fax: [phone]

## 2018-03-27 NOTE — Telephone Encounter (Signed)
Patient called stating that she has been having chest pains off and on for several days now.  States that she did take a Nitroglycerin last evening. She is requesting to have appointment with Dr. Diona BrownerMcDowell. She is not having active pains at this time. Will add her to schedule today and forward to Dr. Diona BrownerMcDowell.

## 2018-04-20 ENCOUNTER — Ambulatory Visit: Payer: PRIVATE HEALTH INSURANCE | Admitting: Cardiology

## 2018-07-03 ENCOUNTER — Ambulatory Visit: Payer: 59 | Admitting: Cardiology

## 2018-07-03 ENCOUNTER — Encounter: Payer: Self-pay | Admitting: Cardiology

## 2018-07-03 VITALS — BP 133/73 | HR 81 | Ht 65.0 in | Wt 178.0 lb

## 2018-07-03 DIAGNOSIS — I25119 Atherosclerotic heart disease of native coronary artery with unspecified angina pectoris: Secondary | ICD-10-CM

## 2018-07-03 DIAGNOSIS — E782 Mixed hyperlipidemia: Secondary | ICD-10-CM | POA: Diagnosis not present

## 2018-07-03 DIAGNOSIS — I10 Essential (primary) hypertension: Secondary | ICD-10-CM

## 2018-07-03 NOTE — Progress Notes (Signed)
Cardiology Office Note  Date: 07/03/2018   ID: Suzanne Tapia, DOB Sep 24, 1955, MRN [id number]  PCP:  Jester, DO  Primary Cardiologist: Nona Dell, MD   Chief Complaint  Patient presents with  . Coronary Artery Disease    History of Present Illness: Suzanne Tapia is a 63 y.o. female last seen in November 2019.  She is here with her husband for a routine visit.  Since last assessment we added back Imdur 30 mg daily and she reports only occasional angina symptoms.  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  I reviewed her medications which are outlined below and otherwise stable.  She reports no other major health changes.  Past Medical History:  Diagnosis Date  . CAD (coronary artery disease)    Mild to moderate by cardiac catheterization December 2015  . Essential hypertension   . History of palpitations    Negative CardioNet monitor 2012  . Mixed hyperlipidemia   . Type 2 diabetes mellitus (HCC)     Past Surgical History:  Procedure Laterality Date  . BILATERAL SALPINGOOPHORECTOMY    . BREAST LUMPECTOMY Bilateral   . CARPAL TUNNEL RELEASE Left   . LEFT HEART CATHETERIZATION WITH CORONARY ANGIOGRAM N/A 04/28/2014   Procedure: LEFT HEART CATHETERIZATION WITH CORONARY ANGIOGRAM;  Surgeon: Micheline Chapman, MD;  Location: [facility] CATH LAB;  Service: Cardiovascular;  Laterality: N/A;    Current Outpatient Medications  Medication Sig Dispense Refill  . aspirin EC 81 MG tablet Take 81 mg by mouth daily.      Marland Kitchen buPROPion (WELLBUTRIN XL) 150 MG 24 hr tablet Take 150 mg by mouth daily.     . enalapril (VASOTEC) 20 MG tablet Take 20 mg by mouth daily.      . fenofibrate (TRICOR) 145 MG tablet Take 145 mg by mouth daily.    . Insulin Glargine (BASAGLAR KWIKPEN) 100 UNIT/ML SOPN Inject 100 Units into the skin every morning.    . insulin lispro (HUMALOG) 100 UNIT/ML injection Inject 50 Units into the skin 2 (two) times daily.     .  isosorbide mononitrate (IMDUR) 30 MG 24 hr tablet Take 1 tablet (30 mg total) by mouth every morning. 90 tablet 3  . loratadine (CLARITIN) 10 MG tablet Take 10 mg by mouth daily.    . nitroGLYCERIN (NITROSTAT) 0.4 MG SL tablet Place 1 tablet (0.4 mg total) under the tongue every 5 (five) minutes x 3 doses as needed for chest pain (If no relief after 3rd dose, proceed to the ED for an evaluation). U 25 tablet 3  . omeprazole (PRILOSEC) 20 MG capsule Take 20 mg by mouth daily.    . rosuvastatin (CRESTOR) 5 MG tablet Take 5 mg by mouth daily.     No current facility-administered medications for this visit.    Allergies:  Sulfa antibiotics   Social History: The patient  reports that she quit smoking about 18 years ago. Her smoking use included cigarettes. She started smoking about 37 years ago. She has a 3.00 pack-year smoking history. She has never used smokeless tobacco. She reports that she does not drink alcohol or use drugs.   ROS:  Please see the history of present illness. Otherwise, complete review of systems is positive for none.  All other systems are reviewed and negative.   Physical Exam: VS:  BP 133/73 (BP Location: Right Arm)   Pulse 81   Ht 5\' 5"  (1.651 m)  Wt 178 lb (80.7 kg)   SpO2 97%   BMI 29.62 kg/m , BMI Body mass index is 29.62 kg/m.  Wt Readings from Last 3 Encounters:  07/03/18 178 lb (80.7 kg)  03/27/18 176 lb 3.2 oz (79.9 kg)  03/03/17 179 lb 9.6 oz (81.5 kg)    General: Patient appears comfortable at rest. HEENT: Conjunctiva and lids normal, oropharynx clear. Neck: Supple, no elevated JVP or carotid bruits, no thyromegaly. Lungs: Clear to auscultation, nonlabored breathing at rest. Cardiac: Regular rate and rhythm, no S3, 2/6 systolic murmur. Abdomen: Soft, nontender, bowel sounds present. Extremities: No pitting edema, distal pulses 2+.  ECG: I personally reviewed the tracing from 03/27/2018 which showed sinus rhythm with left bundle branch  block.  Recent Labwork:  September 2018: BUN 17, creatinine 0.9, potassium 4.1, AST 36, ALT 37, hemoglobin A1c 7.2, cholesterol 190, triglycerides 237, HDL 32, LDL 111, hemoglobin 13.8, platelets 410  Other Studies Reviewed Today:  Cardiac catheterization 04/28/2014: Hemodynamics: AO 118/52 LV 118/7  Coronary angiography: Coronary dominance: right  Left mainstem: The left main stem is short. The vessel was calcified. The left main is widely patent without obstruction.  Left anterior descending (LAD): The LAD is severely calcified, especially in the midportion of the vessel. The proximal LAD is patent with scattered 20% stenoses. The diagonal branches are patent with mild ostial stenosis. The mid LAD has 50% stenosis. The LAD reaches the LV apex. There is no high-grade obstructive disease identified throughout the LAD distribution.  Left circumflex (LCx): The left circumflex is patent. There are 3 obtuse marginal branches present. The mid AV circumflex just after the second obtuse marginal has a 50-60% stenosis. The OM branches are small and diffusely diseased. The third OM is the largest of the obtuse marginal branches.  Right coronary artery (RCA): The RCA is diffusely diseased through its midportion. There is diffuse 30-40% stenosis present. The PDA and PLA branches are patent without significant stenosis. There is diffuse calcification of the proximal and mid RCA.  Left ventriculography: Left ventricular systolic function is normal, LVEF is estimated at 65%, there is no significant mitral regurgitation   Estimated Blood Loss: Minimal  Final Conclusions:  1. Mild to moderate diffuse coronary artery disease  2. Normal LV systolic function  Assessment and Plan:  1.  History of mild to moderate CAD.  She has intermittent angina symptoms and possibly microvascular angina.  Addition of Imdur has been helpful and we will plan to continue this for now with observation.  2.   Essential hypertension, no changes made to current regimen.  Keep follow-up with PCP.  3.  Mixed hyperlipidemia on Crestor.  Current medicines were reviewed with the patient today.  Disposition: Follow-up in 6 months.  Signed, Jonelle Sidle, MD, [facility] 07/03/2018 4:13 PM    [facility] at [facility] [address] [place], [place], [place] [address] Phone: [phone]; Fax: [phone]

## 2018-07-03 NOTE — Patient Instructions (Signed)

## 2019-02-18 ENCOUNTER — Other Ambulatory Visit: Payer: Self-pay | Admitting: Cardiology

## 2019-02-25 ENCOUNTER — Telehealth: Payer: Self-pay | Admitting: Cardiology

## 2019-02-25 NOTE — Telephone Encounter (Signed)

## 2019-02-26 ENCOUNTER — Ambulatory Visit: Payer: 59 | Admitting: Cardiology

## 2019-02-26 ENCOUNTER — Encounter: Payer: Self-pay | Admitting: Cardiology

## 2019-02-26 ENCOUNTER — Other Ambulatory Visit: Payer: Self-pay

## 2019-02-26 VITALS — BP 119/64 | HR 73 | Ht 65.0 in | Wt 184.8 lb

## 2019-02-26 DIAGNOSIS — I25119 Atherosclerotic heart disease of native coronary artery with unspecified angina pectoris: Secondary | ICD-10-CM

## 2019-02-26 DIAGNOSIS — I10 Essential (primary) hypertension: Secondary | ICD-10-CM

## 2019-02-26 DIAGNOSIS — E782 Mixed hyperlipidemia: Secondary | ICD-10-CM | POA: Diagnosis not present

## 2019-02-26 MED ORDER — NITROGLYCERIN 0.4 MG SL SUBL: 0.4000 mg | SUBLINGUAL_TABLET | SUBLINGUAL | 3 refills | Status: DC | PRN

## 2019-02-26 NOTE — Patient Instructions (Addendum)

## 2019-02-26 NOTE — Progress Notes (Signed)
Cardiology Office Note  Date: 02/26/2019   ID: Suzanne Tapia, DOB [birth year], MRN [id number]  PCP:  Adaline Sill, NP  Cardiologist:  Rozann Lesches, MD Electrophysiologist:  None   Chief Complaint  Patient presents with  . Cardiac follow-up    History of Present Illness: Suzanne Tapia is a 63 y.o. female last seen in March.  She presents for a routine visit.  She does not report any significant angina symptoms in the interim, no nitroglycerin use.  She still works in [employer], currently on medical records.  I reviewed her medications which are outlined below.  She saw her PCP recently and had lab work including lipids.  Plan is to take her off fenofibrate with increase in Crestor.  I personally reviewed her ECG today which shows sinus rhythm with left bundle branch block, old.  Past Medical History:  Diagnosis Date  . CAD (coronary artery disease)    Mild to moderate by cardiac catheterization December 2015  . Essential hypertension   . History of palpitations    Negative CardioNet monitor 2012  . Mixed hyperlipidemia   . Type 2 diabetes mellitus ([place])     Past Surgical History:  Procedure Laterality Date  . BILATERAL SALPINGOOPHORECTOMY    . BREAST LUMPECTOMY Bilateral   . CARPAL TUNNEL RELEASE Left   . LEFT HEART CATHETERIZATION WITH CORONARY ANGIOGRAM N/A 04/28/2014   Procedure: LEFT HEART CATHETERIZATION WITH CORONARY ANGIOGRAM;  Surgeon: Blane Ohara, MD;  Location: [facility] CATH LAB;  Service: Cardiovascular;  Laterality: N/A;    Current Outpatient Medications  Medication Sig Dispense Refill  . aspirin EC 81 MG tablet Take 81 mg by mouth daily.      . enalapril (VASOTEC) 20 MG tablet Take 20 mg by mouth daily.      . fenofibrate (TRICOR) 145 MG tablet Take 145 mg by mouth daily.    . Insulin Glargine (BASAGLAR KWIKPEN) 100 UNIT/ML SOPN Inject 100 Units into the skin every morning.    . insulin lispro (HUMALOG) 100 UNIT/ML injection Inject 50  Units into the skin 2 (two) times daily.     . isosorbide mononitrate (IMDUR) 30 MG 24 hr tablet TAKE ONE TABLET EVERY MORNING 90 tablet 3  . loratadine (CLARITIN) 10 MG tablet Take 10 mg by mouth daily.    . nitroGLYCERIN (NITROSTAT) 0.4 MG SL tablet Place 1 tablet (0.4 mg total) under the tongue every 5 (five) minutes x 3 doses as needed for chest pain (If no relief after 3rd dose, proceed to the ED for an evaluation). U 25 tablet 3  . omeprazole (PRILOSEC) 20 MG capsule Take 20 mg by mouth daily.    . rosuvastatin (CRESTOR) 5 MG tablet Take 5 mg by mouth daily.     No current facility-administered medications for this visit.    Allergies:  Sulfa antibiotics   Social History: The patient  reports that she quit smoking about 18 years ago. Her smoking use included cigarettes. She started smoking about 38 years ago. She has a 3.00 pack-year smoking history. She has never used smokeless tobacco. She reports that she does not drink alcohol or use drugs.   ROS:  Please see the history of present illness. Otherwise, complete review of systems is positive for none.  All other systems are reviewed and negative.   Physical Exam: VS:  BP 119/64   Pulse 73   Ht 5\' 5"  (1.651 m)   Wt 184 lb 12.8 oz (  83.8 kg)   SpO2 97%   BMI 30.75 kg/m , BMI Body mass index is 30.75 kg/m.  Wt Readings from Last 3 Encounters:  02/26/19 184 lb 12.8 oz (83.8 kg)  07/03/18 178 lb (80.7 kg)  03/27/18 176 lb 3.2 oz (79.9 kg)    General: Patient appears comfortable at rest. HEENT: Conjunctiva and lids normal, wearing a mask. Neck: Supple, no elevated JVP or carotid bruits, no thyromegaly. Lungs: Clear to auscultation, nonlabored breathing at rest. Cardiac: Regular rate and rhythm, no S3, 2/6 systolic murmur, no pericardial rub. Abdomen: Soft, nontender, bowel sounds present, no guarding or rebound. Extremities: No pitting edema, distal pulses 2+.  ECG:  An ECG dated 03/27/2018 was personally reviewed today and  demonstrated:  Sinus rhythm with left bundle branch block.  Recent Labwork:  September 2018: BUN 17, creatinine 0.9, potassium 4.1, AST 36, ALT 37, hemoglobin A1c 7.2, cholesterol 190, triglycerides 237, HDL 32, LDL 111, hemoglobin 13.8, platelets 410  Other Studies Reviewed Today:  Cardiac catheterization 04/28/2014: Hemodynamics: AO 118/52 LV 118/7  Coronary angiography: Coronary dominance: right  Left mainstem: The left main stem is short. The vessel was calcified. The left main is widely patent without obstruction.  Left anterior descending (LAD): The LAD is severely calcified, especially in the midportion of the vessel. The proximal LAD is patent with scattered 20% stenoses. The diagonal branches are patent with mild ostial stenosis. The mid LAD has 50% stenosis. The LAD reaches the LV apex. There is no high-grade obstructive disease identified throughout the LAD distribution.  Left circumflex (LCx): The left circumflex is patent. There are 3 obtuse marginal branches present. The mid AV circumflex just after the second obtuse marginal has a 50-60% stenosis. The OM branches are small and diffusely diseased. The third OM is the largest of the obtuse marginal branches.  Right coronary artery (RCA): The RCA is diffusely diseased through its midportion. There is diffuse 30-40% stenosis present. The PDA and PLA branches are patent without significant stenosis. There is diffuse calcification of the proximal and mid RCA.  Left ventriculography: Left ventricular systolic function is normal, LVEF is estimated at 65%, there is no significant mitral regurgitation   Estimated Blood Loss: Minimal  Final Conclusions:  1. Mild to moderate diffuse coronary artery disease  2. Normal LV systolic function  Assessment and Plan:  1.  Mild to moderate nonobstructive CAD, managing medically.  She also has microvascular angina as well.  She is tolerating current regimen including aspirin,  Vasotec, Imdur, and statin therapy.  2.  Mixed hyperlipidemia, had recent follow-up with PCP and plan to stop fenofibrate with increase in Crestor.  If triglycerides go up later, could consider addition of omega-3 supplements.  3.  Essential hypertension, blood pressure is normal today.  Medication Adjustments/Labs and Tests Ordered: Current medicines are reviewed at length with the patient today.  Concerns regarding medicines are outlined above.   Tests Ordered: Orders Placed This Encounter  Procedures  . EKG 12-Lead    Medication Changes: Meds ordered this encounter  Medications  . nitroGLYCERIN (NITROSTAT) 0.4 MG SL tablet    Sig: Place 1 tablet (0.4 mg total) under the tongue every 5 (five) minutes x 3 doses as needed for chest pain (If no relief after 3rd dose, proceed to the ED for an evaluation). U    Dispense:  25 tablet    Refill:  3    Disposition:  Follow up 6 months in the [place] office.  Signed, Jonelle SidleSamuel G. ,  MD, [facility] 02/26/2019 4:00 PM    [facility] at [facility] [address] [place], [place], [place] [address] Phone: [phone]; Fax: [phone]

## 2019-05-02 ENCOUNTER — Telehealth: Payer: Self-pay | Admitting: Cardiology

## 2019-05-02 NOTE — Telephone Encounter (Signed)
Patient called stating that her top # of BP has been running high for 2 weeks now. Having more angina attacks also.  [phone] (work)  Patient states that Martin Majestic, NP at [facility] told her to contact Dr. Domenic Polite.

## 2019-05-06 NOTE — Telephone Encounter (Signed)
Reports for the past 2 weeks, BP has been elevated as high as 170-190/84 and BP was checked at work mostly and once at home. Reports taking nitro x's 1 for chest pain on Christmas Eve while in [place] shopping. Reports no more chest pain since that time BP today by home wrist cuff 156/72 & HR 77 Contacted PCP last week about symptoms and was instructed to take 2 enalapril (40 mg) if BP continued to be elevated but she wanted to makes sure that was okay with cardiologist. Also reports that she was given lexapro 10 mg for anxiety by PCP but is only taking 5 mg daily due to side effects of dizziness with higher dose.  Advised that she needed to monitor her BP with consistency for 2 weeks and contact our office if her BP is still elevated. Advised if her chest pain returns, to contact our office for an evaluation.  Verbalized understanding of plan

## 2019-05-06 NOTE — Telephone Encounter (Signed)
Agree.  Reasonable to increase baseline antihypertensive regimen for better blood pressure control.  Would track blood pressure periodically as you mention.

## 2019-08-01 ENCOUNTER — Telehealth: Payer: Self-pay | Admitting: Cardiology

## 2019-08-01 ENCOUNTER — Encounter: Payer: Self-pay | Admitting: *Deleted

## 2019-08-01 ENCOUNTER — Telehealth: Payer: Self-pay | Admitting: *Deleted

## 2019-08-01 NOTE — Telephone Encounter (Signed)
Reports being sent to ED for IV fluids yesterday and was informed by the ED doctor that she had a LBBB. Patient says she was unaware of this diagnosis. Advised patient that her chart was reviewed and dating as far back as 2018-her ekg's showed LBBB. Advised that structural heart changes can occur with CAD. Advised that her last office visit and ekg also showed LBBB and was reviewed by her provider. (I personally reviewed her ECG today which shows sinus rhythm with left bundle branch block, old). Advised that after records received, her EKG will be sent to her provider to review. Verbalized understanding.

## 2019-08-01 NOTE — Telephone Encounter (Signed)
Patient informed. 

## 2019-08-01 NOTE — Telephone Encounter (Signed)
-----   Message from Jonelle Sidle, MD sent at 08/01/2019  3:01 PM EDT ----- Results reviewed.  Tracing from recent ER visit at [facility] is similar to prior tracings dating back to 2015.  Left bundle branch block is not new.

## 2019-08-01 NOTE — Telephone Encounter (Signed)
Patient went to [facility] ED for dehydration and was told that she has a heart condition that she was not aware of or heard of.

## 2019-08-01 NOTE — Telephone Encounter (Signed)
Records requested for review.

## 2019-10-02 ENCOUNTER — Ambulatory Visit: Payer: 59 | Admitting: Cardiology

## 2019-10-02 ENCOUNTER — Encounter: Payer: Self-pay | Admitting: Cardiology

## 2019-10-02 ENCOUNTER — Other Ambulatory Visit: Payer: Self-pay

## 2019-10-02 VITALS — BP 118/64 | HR 77 | Ht 65.0 in | Wt 181.2 lb

## 2019-10-02 DIAGNOSIS — I25119 Atherosclerotic heart disease of native coronary artery with unspecified angina pectoris: Secondary | ICD-10-CM | POA: Diagnosis not present

## 2019-10-02 DIAGNOSIS — Z79899 Other long term (current) drug therapy: Secondary | ICD-10-CM

## 2019-10-02 DIAGNOSIS — E782 Mixed hyperlipidemia: Secondary | ICD-10-CM

## 2019-10-02 NOTE — Patient Instructions (Addendum)
Medication Instructions:    Your physician recommends that you continue on your current medications as directed. Please refer to the Current Medication list given to you today.  Labwork: Your physician recommends that you return for a FASTING lipid/liver profile as soon as possible. Please do not eat or drink for at least 8 hours when you have this done. You may take your medications that morning with a sip of water.  This may be done at [facility] or [facility] or [facility] ([address] [place]) Monday-Friday from 8:00 am - 4:00 pm. No appointment is needed.  Testing/Procedures:  NONE  Follow-Up:  Your physician recommends that you schedule a follow-up appointment in: 6 months (office).  Any Other Special Instructions Will Be Listed Below (If Applicable).  If you need a refill on your cardiac medications before your next appointment, please call your pharmacy.

## 2019-10-02 NOTE — Progress Notes (Signed)
Cardiology Office Note  Date: 10/02/2019   ID: Suzanne Tapia, DOB May 09, 1955, MRN [id number]  PCP:  Adaline Sill, NP  Cardiologist:  Rozann Lesches, MD Electrophysiologist:  None   Chief Complaint  Patient presents with  . Cardiac follow-up    History of Present Illness: Suzanne Tapia is a 64 y.o. female last seen in October 2020.  She presents for a follow-up visit.  From a cardiac perspective, she does not report any progressive angina symptoms on medical therapy.  She was seen in the ER at [facility] in late March, states that she went there with a UTI and pyelonephritis.  During work-up she states that she was told that she had rhabdomyolysis and told to come off of Crestor and fenofibrate.  I reviewed the lab work, her CK-MB was only 212, creatinine was normal, she did have mild elevations in AST and ALT in the 50-60 range.  She was not having any muscle pain at that time.  Troponin T levels were also normal.  ECG showed old left bundle branch block pattern.  She is back to baseline.  I reviewed her medications which are outlined below.  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  Past Medical History:  Diagnosis Date  . CAD (coronary artery disease)    Mild to moderate by cardiac catheterization December 2015  . Essential hypertension   . History of palpitations    Negative CardioNet monitor 2012  . Mixed hyperlipidemia   . Type 2 diabetes mellitus ([place])     Past Surgical History:  Procedure Laterality Date  . BILATERAL SALPINGOOPHORECTOMY    . BREAST LUMPECTOMY Bilateral   . CARPAL TUNNEL RELEASE Left   . LEFT HEART CATHETERIZATION WITH CORONARY ANGIOGRAM N/A 04/28/2014   Procedure: LEFT HEART CATHETERIZATION WITH CORONARY ANGIOGRAM;  Surgeon: Blane Ohara, MD;  Location: [facility] CATH LAB;  Service: Cardiovascular;  Laterality: N/A;    Current Outpatient Medications  Medication Sig Dispense Refill  . aspirin EC 81 MG tablet Take 81 mg by mouth  daily.      . enalapril (VASOTEC) 20 MG tablet Take 20 mg by mouth daily.      . Insulin Glargine (TOUJEO SOLOSTAR [place]) Inject 100 Units into the skin in the morning.    . insulin lispro (HUMALOG) 100 UNIT/ML injection Inject 50 Units into the skin 2 (two) times daily.     . isosorbide mononitrate (IMDUR) 30 MG 24 hr tablet TAKE ONE TABLET EVERY MORNING 90 tablet 3  . loratadine (CLARITIN) 10 MG tablet Take 10 mg by mouth daily.    . nitroGLYCERIN (NITROSTAT) 0.4 MG SL tablet Place 1 tablet (0.4 mg total) under the tongue every 5 (five) minutes x 3 doses as needed for chest pain (If no relief after 3rd dose, proceed to the ED for an evaluation). U 25 tablet 3  . omeprazole (PRILOSEC) 20 MG capsule Take 20 mg by mouth daily.     No current facility-administered medications for this visit.   Allergies:  Sulfa antibiotics   ROS:   No palpitations or syncope.  Physical Exam: VS:  BP 118/64   Pulse 77   Ht 5\' 5"  (1.651 m)   Wt 181 lb 3.2 oz (82.2 kg)   SpO2 93%   BMI 30.15 kg/m , BMI Body mass index is 30.15 kg/m.  Wt Readings from Last 3 Encounters:  10/02/19 181 lb 3.2 oz (82.2 kg)  02/26/19 184 lb 12.8 oz (  83.8 kg)  07/03/18 178 lb (80.7 kg)    General: Patient appears comfortable at rest. HEENT: Conjunctiva and lids normal, wearing a mask. Neck: Supple, no elevated JVP or carotid bruits, no thyromegaly. Lungs: Clear to auscultation, nonlabored breathing at rest. Cardiac: Regular rate and rhythm, no S3, 2/6 systolic murmur. Extremities: No pitting edema, distal pulses 2+.  ECG:  An ECG dated 07/31/2019 was personally reviewed today and demonstrated:  Sinus rhythm with left bundle branch block.  Recent Labwork:  March 2021: BUN 14, creatinine 0.73, potassium 3.9, troponin T less than 0.01, AST 51, ALT 60, hemoglobin 12.8, platelets 351  Other Studies Reviewed Today:  Cardiac catheterization 04/28/2014: Hemodynamics: AO 118/52 LV 118/7  Coronary angiography: Coronary  dominance: right  Left mainstem: The left main stem is short. The vessel was calcified. The left main is widely patent without obstruction.  Left anterior descending (LAD): The LAD is severely calcified, especially in the midportion of the vessel. The proximal LAD is patent with scattered 20% stenoses. The diagonal branches are patent with mild ostial stenosis. The mid LAD has 50% stenosis. The LAD reaches the LV apex. There is no high-grade obstructive disease identified throughout the LAD distribution.  Left circumflex (LCx): The left circumflex is patent. There are 3 obtuse marginal branches present. The mid AV circumflex just after the second obtuse marginal has a 50-60% stenosis. The OM branches are small and diffusely diseased. The third OM is the largest of the obtuse marginal branches.  Right coronary artery (RCA): The RCA is diffusely diseased through its midportion. There is diffuse 30-40% stenosis present. The PDA and PLA branches are patent without significant stenosis. There is diffuse calcification of the proximal and mid RCA.  Left ventriculography: Left ventricular systolic function is normal, LVEF is estimated at 65%, there is no significant mitral regurgitation   Estimated Blood Loss: Minimal  Final Conclusions:  1. Mild to moderate diffuse coronary artery disease  2. Normal LV systolic function  Assessment and Plan:  1.  Moderate nonobstructive CAD.  Plan to continue medical therapy and observation.  Continue aspirin, Vasotec, and Imdur.  2.  Mixed hyperlipidemia.  I reviewed her lab work from March as discussed above.  At this point we will obtain an FLP and LFTs with eye toward resuming low-dose statin therapy.  It does not look like she actually had rhabdomyolysis, although she did have minor increase in AST and ALT.  Would not resume fenofibrate.  Medication Adjustments/Labs and Tests Ordered: Current medicines are reviewed at length with the patient today.   Concerns regarding medicines are outlined above.   Tests Ordered: Orders Placed This Encounter  Procedures  . Lipid panel  . Hepatic function panel    Medication Changes: No orders of the defined types were placed in this encounter.   Disposition:  Follow up 6 months in the [place] office.  Signed, Jonelle Sidle, MD, [facility] 10/02/2019 4:38 PM    [facility] at [facility] [address] [place], [place], [place] [address] Phone: [phone]; Fax: [phone]

## 2019-10-10 ENCOUNTER — Telehealth: Payer: Self-pay | Admitting: *Deleted

## 2019-10-10 DIAGNOSIS — E782 Mixed hyperlipidemia: Secondary | ICD-10-CM

## 2019-10-10 DIAGNOSIS — Z79899 Other long term (current) drug therapy: Secondary | ICD-10-CM

## 2019-10-10 NOTE — Telephone Encounter (Signed)
Reviewed recent lab work in [employer].  LDL is up to 152, LFTs are normal.  Suggest resuming Crestor 5 mg daily, check FLP and LFT in 8 weeks.

## 2019-10-11 MED ORDER — ROSUVASTATIN CALCIUM 5 MG PO TABS: 5.0000 mg | ORAL_TABLET | Freq: Every day | ORAL | 3 refills | Status: DC

## 2019-10-11 NOTE — Telephone Encounter (Signed)
Patient's husband informed and verbalized understanding of plan. Copy sent to PCP

## 2020-01-15 LAB — COLOGUARD: COLOGUARD: NEGATIVE

## 2020-02-14 ENCOUNTER — Other Ambulatory Visit: Payer: Self-pay

## 2020-02-14 ENCOUNTER — Other Ambulatory Visit: Payer: Self-pay | Admitting: Cardiology

## 2020-02-14 MED ORDER — ISOSORBIDE MONONITRATE ER 30 MG PO TB24: 30.0000 mg | ORAL_TABLET | Freq: Every morning | ORAL | 3 refills | Status: DC

## 2020-04-10 ENCOUNTER — Encounter: Payer: Self-pay | Admitting: Cardiology

## 2020-04-10 ENCOUNTER — Ambulatory Visit: Payer: 59 | Admitting: Cardiology

## 2020-04-10 ENCOUNTER — Other Ambulatory Visit: Payer: Self-pay

## 2020-04-10 VITALS — BP 150/62 | HR 82 | Resp 16 | Ht 65.0 in | Wt 184.4 lb

## 2020-04-10 DIAGNOSIS — E782 Mixed hyperlipidemia: Secondary | ICD-10-CM

## 2020-04-10 DIAGNOSIS — I25119 Atherosclerotic heart disease of native coronary artery with unspecified angina pectoris: Secondary | ICD-10-CM | POA: Diagnosis not present

## 2020-04-10 MED ORDER — NITROGLYCERIN 0.4 MG SL SUBL: 0.4000 mg | SUBLINGUAL_TABLET | SUBLINGUAL | 3 refills | Status: DC | PRN

## 2020-04-10 MED ORDER — METOPROLOL SUCCINATE ER 25 MG PO TB24: 12.5000 mg | ORAL_TABLET | Freq: Every day | ORAL | 3 refills | Status: DC

## 2020-04-10 NOTE — Progress Notes (Signed)
Cardiology Office Note  Date: 04/10/2020   ID: Suzanne Tapia, DOB 10-20-1955, MRN [id number]  PCP:  Rebekah Chesterfield, NP  Cardiologist:  Nona Dell, MD Electrophysiologist:  None   Chief Complaint  Patient presents with  . Follow-up    6 mth    History of Present Illness: Suzanne Tapia is a 64 y.o. female last seen in June.  She presents for a routine visit.  Still working full-time at [employer].  She states that she has been very busy recently.  She does report intermittent angina symptoms.  We went over her medications and discussed addition of low-dose beta-blocker, also refilling her as needed nitroglycerin.  She has been back on Crestor since the summer.  Lab work from June is outlined below.  Follow-up FLP and LFTs are planned.   Past Medical History:  Diagnosis Date  . CAD (coronary artery disease)    Mild to moderate by cardiac catheterization December 2015  . Essential hypertension   . History of palpitations    Negative CardioNet monitor 2012  . Mixed hyperlipidemia   . Type 2 diabetes mellitus (HCC)     Past Surgical History:  Procedure Laterality Date  . BILATERAL SALPINGOOPHORECTOMY    . BREAST LUMPECTOMY Bilateral   . CARPAL TUNNEL RELEASE Left   . LEFT HEART CATHETERIZATION WITH CORONARY ANGIOGRAM N/A 04/28/2014   Procedure: LEFT HEART CATHETERIZATION WITH CORONARY ANGIOGRAM;  Surgeon: Micheline Chapman, MD;  Location: [facility] CATH LAB;  Service: Cardiovascular;  Laterality: N/A;    Current Outpatient Medications  Medication Sig Dispense Refill  . aspirin EC 81 MG tablet Take 81 mg by mouth daily.    . enalapril (VASOTEC) 20 MG tablet Take 20 mg by mouth daily.    Marland Kitchen escitalopram (LEXAPRO) 10 MG tablet Take 5 mg by mouth daily.    . insulin glargine, 1 Unit Dial, (TOUJEO SOLOSTAR) 300 UNIT/ML Solostar Pen Inject 100 Units into the skin every morning.    . insulin lispro (HUMALOG) 100 UNIT/ML injection Inject 50 Units into the skin 2 (two)  times daily.    . isosorbide mononitrate (IMDUR) 30 MG 24 hr tablet Take 1 tablet (30 mg total) by mouth every morning. 90 tablet 3  . loratadine (CLARITIN) 10 MG tablet Take 10 mg by mouth daily.    Marland Kitchen omeprazole (PRILOSEC) 20 MG capsule Take 20 mg by mouth daily.    . metoprolol succinate (TOPROL XL) 25 MG 24 hr tablet Take 0.5 tablets (12.5 mg total) by mouth daily. 45 tablet 3  . nitroGLYCERIN (NITROSTAT) 0.4 MG SL tablet Place 1 tablet (0.4 mg total) under the tongue every 5 (five) minutes x 3 doses as needed for chest pain (If no relief after 2nd dose, proceed to the ED for an evaluation). U 25 tablet 3  . rosuvastatin (CRESTOR) 5 MG tablet Take 1 tablet (5 mg total) by mouth daily. 90 tablet 3   No current facility-administered medications for this visit.   Allergies:  Sulfa antibiotics   ROS: No palpitations or syncope.  Physical Exam: VS:  BP (!) 150/62   Pulse 82   Resp 16   Ht 5\' 5"  (1.651 m)   Wt 184 lb 6.4 oz (83.6 kg)   SpO2 96%   BMI 30.69 kg/m , BMI Body mass index is 30.69 kg/m.  Wt Readings from Last 3 Encounters:  04/10/20 184 lb 6.4 oz (83.6 kg)  10/02/19 181 lb 3.2 oz (82.2 kg)  02/26/19  184 lb 12.8 oz (83.8 kg)    General: Patient appears comfortable at rest. HEENT: Conjunctiva and lids normal, wearing a mask. Neck: Supple, no elevated JVP or carotid bruits, no thyromegaly. Lungs: Clear to auscultation, nonlabored breathing at rest. Cardiac: Regular rate and rhythm, no S3, 2/6 systolic murmur, no pericardial rub. Extremities: No pitting edema.  ECG:  An ECG dated 07/31/2019 was personally reviewed today and demonstrated:  Sinus rhythm with left bundle branch block.  Recent Labwork:  March 2021: BUN 14, creatinine 0.73, potassium 3.9, troponin T less than 0.01, AST 51, ALT 60, hemoglobin 12.8, platelets 351 June 2021: Cholesterol 250, LDL 152, HDL 28, triglycerides 348, AST 18, ALT 36  Other Studies Reviewed Today:  Cardiac catheterization  04/28/2014: Hemodynamics: AO 118/52 LV 118/7  Coronary angiography: Coronary dominance: right  Left mainstem: The left main stem is short. The vessel was calcified. The left main is widely patent without obstruction.  Left anterior descending (LAD): The LAD is severely calcified, especially in the midportion of the vessel. The proximal LAD is patent with scattered 20% stenoses. The diagonal branches are patent with mild ostial stenosis. The mid LAD has 50% stenosis. The LAD reaches the LV apex. There is no high-grade obstructive disease identified throughout the LAD distribution.  Left circumflex (LCx): The left circumflex is patent. There are 3 obtuse marginal branches present. The mid AV circumflex just after the second obtuse marginal has a 50-60% stenosis. The OM branches are small and diffusely diseased. The third OM is the largest of the obtuse marginal branches.  Right coronary artery (RCA): The RCA is diffusely diseased through its midportion. There is diffuse 30-40% stenosis present. The PDA and PLA branches are patent without significant stenosis. There is diffuse calcification of the proximal and mid RCA.  Left ventriculography: Left ventricular systolic function is normal, LVEF is estimated at 65%, there is no significant mitral regurgitation   Estimated Blood Loss: Minimal  Final Conclusions:  1. Mild to moderate diffuse coronary artery disease  2. Normal LV systolic function  Assessment and Plan:  1.  CAD, moderate and nonobstructive by last evaluation.  Continue medical therapy for treatment of angina.  She is on aspirin, Vasotec, Imdur, and Crestor.  Refill provided for as needed nitroglycerin.  Also adding Toprol-XL beginning at 12.5 mg daily.  2.  Essential hypertension, blood pressure is up today.  Toprol-XL being added.  3.  Mixed hyperlipidemia, tolerating Crestor 5 mg daily.  Check FLP and LFTs.  Her last LDL prior to getting back on statin therapy  consistently was 152.  Medication Adjustments/Labs and Tests Ordered: Current medicines are reviewed at length with the patient today.  Concerns regarding medicines are outlined above.   Tests Ordered: No orders of the defined types were placed in this encounter.   Medication Changes: Meds ordered this encounter  Medications  . nitroGLYCERIN (NITROSTAT) 0.4 MG SL tablet    Sig: Place 1 tablet (0.4 mg total) under the tongue every 5 (five) minutes x 3 doses as needed for chest pain (If no relief after 2nd dose, proceed to the ED for an evaluation). U    Dispense:  25 tablet    Refill:  3  . metoprolol succinate (TOPROL XL) 25 MG 24 hr tablet    Sig: Take 0.5 tablets (12.5 mg total) by mouth daily.    Dispense:  45 tablet    Refill:  3    04/10/2020 NEW    Disposition:  Follow up 6 months  in the [place] office.  Signed, Jonelle Sidle, MD, [facility] 04/10/2020 4:50 PM    [facility] at [facility] [address] [place], [place], [place] [address] Phone: [phone]; Fax: [phone]

## 2020-04-10 NOTE — Patient Instructions (Signed)
Medication Instructions:   Your physician has recommended you make the following change in your medication:   Start toprol xl 12.5 mg by mouth daily  Continue other medications the same  Labwork: Your physician recommends that you return for a FASTING lipid/liver profile in the next 2 weeks. Please do not eat or drink for at least 8 hours when you have this done. You may take your medications that morning with a sip of water.  This may be done at [facility] or [facility] ([address] [place]) Monday-Friday from 8:00 am - 4:00 pm. No appointment is needed.  Testing/Procedures:  None  Follow-Up:  Your physician recommends that you schedule a follow-up appointment in: 6 months.   Any Other Special Instructions Will Be Listed Below (If Applicable).  If you need a refill on your cardiac medications before your next appointment, please call your pharmacy.

## 2020-04-15 ENCOUNTER — Other Ambulatory Visit: Payer: Self-pay | Admitting: Cardiology

## 2020-04-16 LAB — HEPATIC FUNCTION PANEL
AG Ratio: 1.4 (calc) (ref 1.0–2.5)
ALT: 27 U/L (ref 6–29)
AST: 19 U/L (ref 10–35)
Albumin: 4.2 g/dL (ref 3.6–5.1)
Alkaline phosphatase (APISO): 57 U/L (ref 37–153)
Bilirubin, Direct: 0.1 mg/dL (ref 0.0–0.2)
Globulin: 3.1 g/dL (calc) (ref 1.9–3.7)
Indirect Bilirubin: 0.4 mg/dL (calc) (ref 0.2–1.2)
Total Bilirubin: 0.5 mg/dL (ref 0.2–1.2)
Total Protein: 7.3 g/dL (ref 6.1–8.1)

## 2020-04-16 LAB — LIPID PANEL
Cholesterol: 175 mg/dL (ref <200)
HDL: 33 mg/dL — ABNORMAL LOW (ref >=50)
LDL Cholesterol (Calc): 96 mg/dL (calc)
Non-HDL Cholesterol (Calc): 142 mg/dL (calc) — ABNORMAL HIGH (ref <130)
Total CHOL/HDL Ratio: 5.3 (calc) — ABNORMAL HIGH (ref <5.0)
Triglycerides: 342 mg/dL — ABNORMAL HIGH (ref <150)

## 2020-04-20 ENCOUNTER — Telehealth: Payer: Self-pay | Admitting: *Deleted

## 2020-04-20 NOTE — Telephone Encounter (Signed)
Patient's husband informed. Copy sent to PCP 

## 2020-04-20 NOTE — Telephone Encounter (Signed)
-----   Message from Jonelle Sidle, MD sent at 04/17/2020 12:06 PM EST ----- Results reviewed.  LDL has come down to 96, going in the right direction back on Crestor.  Triglycerides remain elevated.

## 2020-09-05 ENCOUNTER — Other Ambulatory Visit: Payer: Self-pay | Admitting: Cardiology

## 2021-03-02 ENCOUNTER — Other Ambulatory Visit: Payer: Self-pay | Admitting: Cardiology

## 2021-03-29 DIAGNOSIS — R059 Cough, unspecified: Secondary | ICD-10-CM | POA: Diagnosis not present

## 2021-05-04 ENCOUNTER — Other Ambulatory Visit: Payer: Self-pay | Admitting: Cardiology

## 2021-05-07 ENCOUNTER — Ambulatory Visit: Payer: BC Managed Care – PPO | Admitting: Cardiology

## 2021-05-07 ENCOUNTER — Encounter: Payer: Self-pay | Admitting: Cardiology

## 2021-05-07 VITALS — BP 146/70 | HR 82 | Ht 65.0 in | Wt 186.4 lb

## 2021-05-07 DIAGNOSIS — E782 Mixed hyperlipidemia: Secondary | ICD-10-CM | POA: Diagnosis not present

## 2021-05-07 DIAGNOSIS — Z23 Encounter for immunization: Secondary | ICD-10-CM

## 2021-05-07 DIAGNOSIS — I25119 Atherosclerotic heart disease of native coronary artery with unspecified angina pectoris: Secondary | ICD-10-CM

## 2021-05-07 MED ORDER — ROSUVASTATIN CALCIUM 10 MG PO TABS: 10.0000 mg | ORAL_TABLET | Freq: Every day | ORAL | 3 refills | Status: DC

## 2021-05-07 NOTE — Progress Notes (Signed)
Cardiology Office Note  Date: 05/07/2021   ID: Suzanne Tapia, DOB 1956-04-03, MRN [id number]  PCP:  Rebekah Chesterfield, NP  Cardiologist:  Nona Dell, MD Electrophysiologist:  None   Chief Complaint  Patient presents with   Cardiac follow-up    History of Present Illness: Suzanne Tapia is a 66 y.o. female last seen in December 2021.  She is here for a follow-up visit with her husband.  She is now semiretired, working as needed for [employer].  She reports occasional angina symptoms, also intermittent sense of palpitations, no sudden dizziness or syncope.  Last LDL was down to 96 on Crestor (previously 152).  We discussed increasing the dose to 10 mg daily.  She will have follow-up lab work with her PCP in February.  Remainder of her medications are stable and outlined below.  I personally reviewed her ECG today which shows sinus rhythm with old left bundle branch block.  Past Medical History:  Diagnosis Date   CAD (coronary artery disease)    Mild to moderate by cardiac catheterization December 2015   Essential hypertension    History of palpitations    Negative CardioNet monitor 2012   Mixed hyperlipidemia    Type 2 diabetes mellitus (HCC)     Past Surgical History:  Procedure Laterality Date   BILATERAL SALPINGOOPHORECTOMY     BREAST LUMPECTOMY Bilateral    CARPAL TUNNEL RELEASE Left    LEFT HEART CATHETERIZATION WITH CORONARY ANGIOGRAM N/A 04/28/2014   Procedure: LEFT HEART CATHETERIZATION WITH CORONARY ANGIOGRAM;  Surgeon: Micheline Chapman, MD;  Location: [facility] CATH LAB;  Service: Cardiovascular;  Laterality: N/A;    Current Outpatient Medications  Medication Sig Dispense Refill   aspirin EC 81 MG tablet Take 81 mg by mouth daily.     enalapril (VASOTEC) 20 MG tablet Take 20 mg by mouth daily.     escitalopram (LEXAPRO) 10 MG tablet Take 5 mg by mouth daily.     insulin glargine, 1 Unit Dial, (TOUJEO SOLOSTAR) 300 UNIT/ML Solostar Pen Inject 108 Units into the skin  every morning.     isosorbide mononitrate (IMDUR) 30 MG 24 hr tablet Take 1 tablet (30 mg total) by mouth every morning. 90 tablet 3   loratadine (CLARITIN) 10 MG tablet Take 10 mg by mouth daily.     metoprolol succinate (TOPROL-XL) 25 MG 24 hr tablet TAKE 1/2 TABLET DAILY 45 tablet 3   nitroGLYCERIN (NITROSTAT) 0.4 MG SL tablet Place 1 tablet (0.4 mg total) under the tongue every 5 (five) minutes x 3 doses as needed for chest pain (If no relief after 2nd dose, proceed to the ED for an evaluation). U 25 tablet 3   NOVOLOG 100 UNIT/ML injection Inject into the skin. 52 units in am and 56 in pm     omeprazole (PRILOSEC) 20 MG capsule Take 20 mg by mouth daily.     rosuvastatin (CRESTOR) 10 MG tablet Take 1 tablet (10 mg total) by mouth daily. 90 tablet 3   No current facility-administered medications for this visit.   Allergies:  Sulfa antibiotics   ROS: No orthopnea or PND.  Physical Exam: VS:  BP (!) 146/70 (BP Location: Right Arm, Patient Position: Sitting, Cuff Size: Normal)    Pulse 82    Ht 5\' 5"  (1.651 m)    Wt 186 lb 6.4 oz (84.6 kg)    SpO2 98%    BMI 31.02 kg/m , BMI Body mass index is 31.02 kg/m.  Wt Readings from Last 3 Encounters:  05/07/21 186 lb 6.4 oz (84.6 kg)  04/10/20 184 lb 6.4 oz (83.6 kg)  10/02/19 181 lb 3.2 oz (82.2 kg)    General: Patient appears comfortable at rest. HEENT: Conjunctiva and lids normal, wearing a mask. Neck: Supple, no elevated JVP or carotid bruits, no thyromegaly. Lungs: Clear to auscultation, nonlabored breathing at rest. Cardiac: Regular rate and rhythm, no S3, 2/6 systolic murmur. Extremities: No pitting edema.  ECG:  An ECG dated 07/31/2019 was personally reviewed today and demonstrated:  Sinus rhythm with left bundle branch block.  Recent Labwork:    Component Value Date/Time   CHOL 175 04/15/2020 1557   TRIG 342 (H) 04/15/2020 1557   HDL 33 (L) 04/15/2020 1557   CHOLHDL 5.3 (H) 04/15/2020 1557   LDLCALC 96 04/15/2020 1557     Other Studies Reviewed Today:  Cardiac catheterization 04/28/2014: Hemodynamics: AO 118/52 LV 118/7   Coronary angiography: Coronary dominance: right   Left mainstem: The left main stem is short. The vessel was calcified. The left main is widely patent without obstruction.   Left anterior descending (LAD): The LAD is severely calcified, especially in the midportion of the vessel. The proximal LAD is patent with scattered 20% stenoses. The diagonal branches are patent with mild ostial stenosis. The mid LAD has 50% stenosis. The LAD reaches the LV apex. There is no high-grade obstructive disease identified throughout the LAD distribution.   Left circumflex (LCx): The left circumflex is patent. There are 3 obtuse marginal branches present. The mid AV circumflex just after the second obtuse marginal has a 50-60% stenosis. The OM branches are small and diffusely diseased. The third OM is the largest of the obtuse marginal branches.   Right coronary artery (RCA): The RCA is diffusely diseased through its midportion. There is diffuse 30-40% stenosis present. The PDA and PLA branches are patent without significant stenosis. There is diffuse calcification of the proximal and mid RCA.   Left ventriculography: Left ventricular systolic function is normal, LVEF is estimated at 65%, there is no significant mitral regurgitation    Estimated Blood Loss: Minimal   Final Conclusions:    1. Mild to moderate diffuse coronary artery disease   2. Normal LV systolic function  Assessment and Plan:  1.  CAD, moderate and nonobstructive by last assessment but with intermittent angina which we are managing medically.  No accelerating symptoms and ECG stable.  Continue aspirin, Vasotec, Imdur, Toprol-XL, and Crestor  2.  Mixed hyperlipidemia, LDL coming down on statin therapy.  Increase Crestor to 10 mg daily with follow-up lab work per PCP in February.  Medication Adjustments/Labs and Tests  Ordered: Current medicines are reviewed at length with the patient today.  Concerns regarding medicines are outlined above.   Tests Ordered: Orders Placed This Encounter  Procedures   EKG 12-Lead    Medication Changes: Meds ordered this encounter  Medications   rosuvastatin (CRESTOR) 10 MG tablet    Sig: Take 1 tablet (10 mg total) by mouth daily.    Dispense:  90 tablet    Refill:  3    05/07/2020 dose increase    Disposition:  Follow up  6 months.  Signed, Jonelle Sidle, MD, [facility] 05/07/2021 1:34 PM    [facility] at [facility] [address] [place], [place], [place] [address] Phone: [phone]; Fax: [phone]

## 2021-05-07 NOTE — Patient Instructions (Addendum)
Medication Instructions:  Your physician has recommended you make the following change in your medication:  Increase crestor to 10 mg daily Continue other medications the same  Labwork: none  Testing/Procedures: none  Follow-Up: Your physician recommends that you schedule a follow-up appointment in: 6 months  Any Other Special Instructions Will Be Listed Below (If Applicable).  If you need a refill on your cardiac medications before your next appointment, please call your pharmacy.

## 2021-06-21 DIAGNOSIS — E109 Type 1 diabetes mellitus without complications: Secondary | ICD-10-CM | POA: Diagnosis not present

## 2021-09-20 DIAGNOSIS — E109 Type 1 diabetes mellitus without complications: Secondary | ICD-10-CM | POA: Diagnosis not present

## 2022-01-17 ENCOUNTER — Ambulatory Visit: Payer: BC Managed Care – PPO | Admitting: Cardiology

## 2022-01-24 NOTE — Progress Notes (Unsigned)
Cardiology Office Note:    Date:  01/25/2022   ID:  Suzanne Tapia, DOB 1955/12/05, MRN [id number]  PCP:  Adaline Sill, NP   [facility] Providers Cardiologist:  Rozann Lesches, MD     Referring MD: Adaline Sill, NP   Chief Complaint: unstable angina  History of Present Illness:    Suzanne Tapia is a very pleasant 66 y.o. female with a hx of palpitations, hyperlipidemia, left BBB, CAD (mild to moderate by cath 2015) with intermittent angina managed medically, HTN, and type 2 DM.   She established cardiology care prior to 2015 for coronary atherosclerosis on CT in 2007. Documentation of calcium score of 281 and scattered areas of noncritical CAD with tandem 50% RCA stenosis, 70% circumflex stenosis and 50-70% proximal LAD. Stress echocardiogram at that time showed no ischemia. 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   She has maintained consistent follow-up and was last seen in cardiology clinic on 05/07/2021 by Dr. Domenic Polite at which time she had no symptoms of accelerating angina. She was advised to increase Crestor to 10 mg daily and was undergoing lipid testing with PCP. Follow-up in 6 months was recommended.  Today, she is here with her husband. Feels that her angina is worse. Was at the beach a few weeks ago and had most severe episode of angina she can recall. She took ntg and symptoms resolved but returned again after she got up and moves around. Took multiple SL NTG over the course of several hours. Was eventually able to sleep and has not had return of pain. Notices worsening symptoms with a full stomach and with acid reflux. Occasional SOB with exertion but not on every occasion of exertion. Typical angina is pain in left chest radiating down left arm, no diaphoresis, n/v.   Past Medical History:  Diagnosis Date    CAD (coronary artery disease)    Mild to moderate by cardiac catheterization December 2015   Essential hypertension    History of palpitations    Negative CardioNet monitor 2012   Mixed hyperlipidemia    Type 2 diabetes mellitus ([place])     Past Surgical History:  Procedure Laterality Date   BILATERAL SALPINGOOPHORECTOMY     BREAST LUMPECTOMY Bilateral    CARPAL TUNNEL RELEASE Left    LEFT HEART CATHETERIZATION WITH CORONARY ANGIOGRAM N/A 04/28/2014   Procedure: LEFT HEART CATHETERIZATION WITH CORONARY ANGIOGRAM;  Surgeon: Blane Ohara, MD;  Location[facility] LAB;  Service: Cardiovascular;  Laterality: N/A;    Current Medications: Current Meds  Medication Sig   aspirin EC 81 MG tablet Take 81 mg by mouth daily.   enalapril (VASOTEC) 20 MG tablet Take 20 mg by mouth daily.   escitalopram (LEXAPRO) 10 MG tablet Take 5 mg by mouth daily.   fenofibrate 160 MG tablet Take 160 mg by mouth daily.   insulin glargine, 1 Unit Dial, (TOUJEO SOLOSTAR) 300 UNIT/ML Solostar Pen Inject 110 Units into the skin every morning.   loratadine (CLARITIN) 10 MG tablet Take 10 mg by mouth daily.   metoprolol succinate (TOPROL-XL) 25 MG 24 hr tablet TAKE 1/2 TABLET DAILY   NOVOLOG 100 UNIT/ML injection Inject into the skin. 55 units in am and 60 in pm   omeprazole (PRILOSEC) 20 MG capsule Take 20 mg by mouth daily.   rosuvastatin (CRESTOR) 10 MG tablet Take 1  tablet (10 mg total) by mouth daily.   [DISCONTINUED] isosorbide mononitrate (IMDUR) 30 MG 24 hr tablet Take 1 tablet (30 mg total) by mouth every morning.   [DISCONTINUED] nitroGLYCERIN (NITROSTAT) 0.4 MG SL tablet Place 1 tablet (0.4 mg total) under the tongue every 5 (five) minutes x 3 doses as needed for chest pain (If no relief after 2nd dose, proceed to the ED for an evaluation). U     Allergies:   Sulfa antibiotics   Social History   Socioeconomic History   Marital status: Married    Spouse name: Not on file   Number of children: Not  on file   Years of education: Not on file   Highest education level: Not on file  Occupational History   Not on file  Tobacco Use   Smoking status: Former    Packs/day: 0.30    Years: 10.00    Total pack years: 3.00    Types: Cigarettes    Start date: 08/10/1980    Quit date: 05/02/2000    Years since quitting: 21.7   Smokeless tobacco: Never  Vaping Use   Vaping Use: Never used  Substance and Sexual Activity   Alcohol use: No    Alcohol/week: 0.0 standard drinks of alcohol   Drug use: No   Sexual activity: Yes    Partners: Female  Other Topics Concern   Not on file  Social History Narrative   Not on file   Social Determinants of Health   Financial Resource Strain: Not on file  Food Insecurity: Not on file  Transportation Needs: Not on file  Physical Activity: Not on file  Stress: Not on file  Social Connections: Not on file     Family History: The patient's family history includes CAD in her sister; Diabetes Mellitus II in her mother; Heart failure in her mother.  ROS:   Please see the history of present illness.    + acid reflux + angina  All other systems reviewed and are negative.  Labs/Other Studies Reviewed:    The following studies were reviewed today:   Cardiac catheterization 04/28/2014: Hemodynamics: AO 118/52 LV 118/7   Coronary angiography: Coronary dominance: right   Left mainstem: The left main stem is short. The vessel was calcified. The left main is widely patent without obstruction.   Left anterior descending (LAD): The LAD is severely calcified, especially in the midportion of the vessel. The proximal LAD is patent with scattered 20% stenoses. The diagonal branches are patent with mild ostial stenosis. The mid LAD has 50% stenosis. The LAD reaches the LV apex. There is no high-grade obstructive disease identified throughout the LAD distribution.   Left circumflex (LCx): The left circumflex is patent. There are 3 obtuse marginal branches  present. The mid AV circumflex just after the second obtuse marginal has a 50-60% stenosis. The OM branches are small and diffusely diseased. The third OM is the largest of the obtuse marginal branches.   Right coronary artery (RCA): The RCA is diffusely diseased through its midportion. There is diffuse 30-40% stenosis present. The PDA and PLA branches are patent without significant stenosis. There is diffuse calcification of the proximal and mid RCA.   Left ventriculography: Left ventricular systolic function is normal, LVEF is estimated at 65%, there is no significant mitral regurgitation    Estimated Blood Loss: Minimal   Final Conclusions:    1. Mild to moderate diffuse coronary artery disease   2. Normal LV systolic function  Recent  Labs: No results found for requested labs within last 365 days.  Recent Lipid Panel    Component Value Date/Time   CHOL 175 04/15/2020 1557   TRIG 342 (H) 04/15/2020 1557   HDL 33 (L) 04/15/2020 1557   CHOLHDL 5.3 (H) 04/15/2020 1557   LDLCALC 96 04/15/2020 1557     Risk Assessment/Calculations:           Physical Exam:    VS:  BP 124/68   Pulse 73   Ht 5\' 5"  (1.651 m)   Wt 175 lb (79.4 kg)   SpO2 95%   BMI 29.12 kg/m     Wt Readings from Last 3 Encounters:  01/25/22 175 lb (79.4 kg)  05/07/21 186 lb 6.4 oz (84.6 kg)  04/10/20 184 lb 6.4 oz (83.6 kg)     GEN:  Well nourished, well developed in no acute distress HEENT: Normal NECK: No JVD; No carotid bruits CARDIAC: RRR, no murmurs, rubs, gallops RESPIRATORY:  Clear to auscultation without rales, wheezing or rhonchi  ABDOMEN: Soft, non-tender, non-distended MUSCULOSKELETAL:  No edema; No deformity. 2+ pedal pulses, equal bilaterally SKIN: Warm and dry NEUROLOGIC:  Alert and oriented x 3 PSYCHIATRIC:  Normal affect   EKG:  EKG is ordered today.  The ekg ordered today demonstrates NSR at 73 bpm, left bundle branch block   Diagnoses:    1. Coronary artery disease involving  native coronary artery of native heart with unstable angina pectoris (HCC)   2. Hyperlipidemia LDL goal <70   3. Essential hypertension   4. Left bundle branch block   5. Chest pain due to GERD    Assessment and Plan:     CAD with unstable angina: Elevated coronary calcium score. Cardiac catheterization 04/2014 revealed calcified left main, widely patent without obstruction, proximal LAD scattered 20% stenosis, mild ostial stenosis, mLAD 50% stenosis, no high-grade obstructive disease throughout LAD distribution.  Mid AV circumflex just above the second obtuse marginal 50 to 60% stenosis, OM branches are small and diffusely diseased, RCA diffusely diseased through midportion, diffuse 30 to 40% stenosis present, diffuse calcification of proximal and mid RCA, normal LVEF. Reports concerning episode of angina that lasted several hours a few weeks ago. No prior history of symptoms that persisted for that length of time. Additionally has frequent symptoms of acid reflux. Feels that symptoms worsen when she has consumed a large meal.  Symptoms typical of angina, could also be exacerbated by uncontrolled GERD.  We will increase isosorbide to 60 mg daily.  Additionally advised her to use Pepcid or Gaviscon in addition to omeprazole. Continue to use SL NTG as needed. Continue aspirin, enalapril, isosorbide, metoprolol, rosuvastatin.  GERD: As noted above, symptoms of chest pressure worsen with a full stomach.  Encouraged her to try to eat smaller portions, continue omeprazole, and take Gaviscon or Pepcid in addition as needed.   Hyperlipidemia LDL goal < 70: LDL was down to 96 with plan to repeat with PCP February 2023. Did not specifically discuss today. Will address at next ov. Continue Crestor.    LBBB: Chronic. EKG is stable today. No presyncope, syncope, dyspnea, orthopnea, edema to suggest worsening LV function. Will consider echo at next office visit.   Hypertension: BP is well-controlled today.  She  reports recent home readings have been higher.  We are increasing isosorbide for angina. Advised her to continue to monitor home BP and report any concerns to March 2023 prior to next office visit.  Disposition: 1 month with Dr. [place]  or APP  Medication Adjustments/Labs and Tests Ordered: Current medicines are reviewed at length with the patient today.  Concerns regarding medicines are outlined above.  Orders Placed This Encounter  Procedures   EKG 12-Lead   Meds ordered this encounter  Medications   nitroGLYCERIN (NITROSTAT) 0.4 MG SL tablet    Sig: Place 1 tablet (0.4 mg total) under the tongue every 5 (five) minutes x 3 doses as needed for chest pain (If no relief after 2nd dose, proceed to the ED for an evaluation). U    Dispense:  25 tablet    Refill:  3   isosorbide mononitrate (IMDUR) 30 MG 24 hr tablet    Sig: Take 2 tablets (60 mg total) by mouth every morning.    Dispense:  180 tablet    Refill:  3    Patient Instructions  Medication Instructions:   INCREASE Imdur two (2) tablets by mouth ( 60 mg) daily.   You can try OTC Pepcid or Gaviscon.   *If you need a refill on your cardiac medications before your next appointment, please call your pharmacy*   Lab Work:  None ordered.  If you have labs (blood work) drawn today and your tests are completely normal, you will receive your results only by: MyChart Message (if you have MyChart) OR A paper copy in the mail If you have any lab test that is abnormal or we need to change your treatment, we will call you to review the results.   Testing/Procedures:  None ordered.   Follow-Up: At [facility], you and your health needs are our priority.  As part of our continuing mission to provide you with exceptional heart care, we have created designated Provider Care Teams.  These Care Teams include your primary Cardiologist (physician) and Advanced Practice Providers (APPs -  Physician Assistants and Nurse Practitioners)  who all work together to provide you with the care you need, when you need it.  We recommend signing up for the patient portal called "MyChart".  Sign up information is provided on this After Visit Summary.  MyChart is used to connect with patients for Virtual Visits (Telemedicine).  Patients are able to view lab/test results, encounter notes, upcoming appointments, etc.  Non-urgent messages can be sent to your provider as well.   To learn more about what you can do with MyChart, go to [email or web address].    Your next appointment:   1 month(s)  The format for your next appointment:   In Person  Provider:   Eligha Bridegroom, NP         Important Information About Sugar         Signed, Levi Aland, NP  01/25/2022 4:39 PM    [facility]

## 2022-01-25 ENCOUNTER — Encounter: Payer: Self-pay | Admitting: Nurse Practitioner

## 2022-01-25 ENCOUNTER — Ambulatory Visit: Payer: BC Managed Care – PPO | Attending: Nurse Practitioner | Admitting: Nurse Practitioner

## 2022-01-25 VITALS — BP 124/68 | HR 73 | Ht 65.0 in | Wt 175.0 lb

## 2022-01-25 DIAGNOSIS — I447 Left bundle-branch block, unspecified: Secondary | ICD-10-CM | POA: Diagnosis not present

## 2022-01-25 DIAGNOSIS — I25110 Atherosclerotic heart disease of native coronary artery with unstable angina pectoris: Secondary | ICD-10-CM

## 2022-01-25 DIAGNOSIS — E785 Hyperlipidemia, unspecified: Secondary | ICD-10-CM | POA: Diagnosis not present

## 2022-01-25 DIAGNOSIS — I10 Essential (primary) hypertension: Secondary | ICD-10-CM

## 2022-01-25 DIAGNOSIS — R079 Chest pain, unspecified: Secondary | ICD-10-CM

## 2022-01-25 DIAGNOSIS — K219 Gastro-esophageal reflux disease without esophagitis: Secondary | ICD-10-CM

## 2022-01-25 MED ORDER — ISOSORBIDE MONONITRATE ER 30 MG PO TB24: 60.0000 mg | ORAL_TABLET | Freq: Every morning | ORAL | 3 refills | Status: DC

## 2022-01-25 MED ORDER — NITROGLYCERIN 0.4 MG SL SUBL: 0.4000 mg | SUBLINGUAL_TABLET | SUBLINGUAL | 3 refills | Status: DC | PRN

## 2022-01-25 NOTE — Patient Instructions (Signed)
Medication Instructions:   INCREASE Imdur two (2) tablets by mouth ( 60 mg) daily.   You can try OTC Pepcid or Gaviscon.   *If you need a refill on your cardiac medications before your next appointment, please call your pharmacy*   Lab Work:  None ordered.  If you have labs (blood work) drawn today and your tests are completely normal, you will receive your results only by: [place] (if you have MyChart) OR A paper copy in the mail If you have any lab test that is abnormal or we need to change your treatment, we will call you to review the results.   Testing/Procedures:  None ordered.   Follow-Up: At [facility], you and your health needs are our priority.  As part of our continuing mission to provide you with exceptional heart care, we have created designated Provider Care Teams.  These Care Teams include your primary Cardiologist (physician) and Advanced Practice Providers (APPs -  Physician Assistants and Nurse Practitioners) who all work together to provide you with the care you need, when you need it.  We recommend signing up for the patient portal called "MyChart".  Sign up information is provided on this After Visit Summary.  MyChart is used to connect with patients for Virtual Visits (Telemedicine).  Patients are able to view lab/test results, encounter notes, upcoming appointments, etc.  Non-urgent messages can be sent to your provider as well.   To learn more about what you can do with MyChart, go to [email or web address].    Your next appointment:   1 month(s)  The format for your next appointment:   In Person  Provider:   Christen Bame, NP         Important Information About Sugar

## 2022-02-16 ENCOUNTER — Other Ambulatory Visit: Payer: Self-pay | Admitting: Cardiology

## 2022-02-22 NOTE — Progress Notes (Unsigned)
Cardiology Office Note:    Date:  02/24/2022   ID:  Suzanne Tapia, DOB Apr 02, 1956, MRN [id number]  PCP:  Rebekah Chesterfield, NP   [facility] Providers Cardiologist:  Nona Dell, MD     Referring MD: Rebekah Chesterfield, NP   Chief Complaint: unstable angina  History of Present Illness:    Suzanne Tapia is a very pleasant 66 y.o. female with a hx of palpitations, hyperlipidemia, left BBB, CAD (mild to moderate by cath 2015) with intermittent angina managed medically, HTN, and type 2 DM.   She established cardiology care prior to 2015 for coronary atherosclerosis on CT in 2007. Documentation of calcium score of 281 and scattered areas of noncritical CAD with tandem 50% RCA stenosis, 70% circumflex stenosis and 50-70% proximal LAD. Stress echocardiogram at that time showed no ischemia. 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   She has maintained consistent follow-up and was last seen in cardiology clinic on 05/07/2021 by Dr. Diona Browner at which time she had no symptoms of accelerating angina. She was advised to increase Crestor to 10 mg daily and was undergoing lipid testing with PCP. Follow-up in 6 months was recommended.  Seen by me on 01/25/22 accompanied by her husband for worsening angina. Was at the beach a few weeks ago and had most severe episode of angina she can recall. She took ntg and symptoms resolved but returned again after she got up and moved around. Took multiple SL NTG over the course of several hours. Was eventually able to sleep. No return of chest pain. Notices worsening symptoms with a full stomach and with acid reflux. Occasional SOB with exertion but not on every occasion of exertion. Typical angina is pain in left chest radiating down left arm, no diaphoresis, n/v. BP was mildly elevated. Was advised to increase  Imdur to 60 mg daily. Also advised her to try Gaviscon or Pepcid for acid reflux.   Today, she is here with her husband for follow-up. Reports an episode of chest pain occurred on Oct 5 after eating.  Was not stressed at that time, was visiting with family and having an enjoyable time. Symptoms were relieved with SL nitroglycerin. She had started the higher dose of Imdur prescribed at office visit on 01/25/2022 just a few days prior.Admits that she gets very worked up when these episodes occur and that anxiety may exacerbate symptoms. Also took several SL ntg when their neighbor was found dead. Was obviously very upset and was concerned that chest pain would worsen. No associated SOB, diaphoresis, n/v. No change in activity tolerance. She denies lower extremity edema, fatigue, palpitations, melena, weakness, presyncope, syncope, orthopnea, and PND.  Past Medical History:  Diagnosis Date   CAD (coronary artery disease)    Mild to moderate by cardiac catheterization December 2015   Essential hypertension    History of palpitations    Negative CardioNet monitor 2012   Mixed hyperlipidemia    Type 2 diabetes mellitus (HCC)     Past Surgical History:  Procedure Laterality Date   BILATERAL SALPINGOOPHORECTOMY     BREAST LUMPECTOMY Bilateral    CARPAL TUNNEL RELEASE Left    LEFT HEART CATHETERIZATION WITH CORONARY ANGIOGRAM N/A 04/28/2014   Procedure: LEFT HEART CATHETERIZATION WITH CORONARY ANGIOGRAM;  Surgeon: Micheline Chapman, MD;  Location: [facility] CATH LAB;  Service: Cardiovascular;  Laterality: N/A;  Current Medications: Current Meds  Medication Sig   aspirin EC 81 MG tablet Take 81 mg by mouth daily.   enalapril (VASOTEC) 20 MG tablet Take 20 mg by mouth daily.   escitalopram (LEXAPRO) 10 MG tablet Take 5 mg by mouth daily.   famotidine (PEPCID) 20 MG tablet Take 20 mg by mouth daily.   fenofibrate 160 MG tablet Take 160 mg by mouth daily.   hydrochlorothiazide (MICROZIDE) 12.5 MG capsule  Take 1 capsule (12.5 mg total) by mouth daily.   insulin glargine, 1 Unit Dial, (TOUJEO SOLOSTAR) 300 UNIT/ML Solostar Pen Inject 110 Units into the skin every morning.   isosorbide mononitrate (IMDUR) 30 MG 24 hr tablet Take 2 tablets (60 mg total) by mouth every morning.   loratadine (CLARITIN) 10 MG tablet Take 10 mg by mouth daily.   metoprolol succinate (TOPROL-XL) 25 MG 24 hr tablet Take 0.5 tablets (12.5 mg total) by mouth daily.   nitroGLYCERIN (NITROSTAT) 0.4 MG SL tablet Place 1 tablet (0.4 mg total) under the tongue every 5 (five) minutes x 3 doses as needed for chest pain (If no relief after 2nd dose, proceed to the ED for an evaluation). U   NOVOLOG 100 UNIT/ML injection Inject into the skin. 55 units in am and 60 in pm   rosuvastatin (CRESTOR) 10 MG tablet Take 1 tablet (10 mg total) by mouth daily.     Allergies:   Sulfa antibiotics   Social History   Socioeconomic History   Marital status: Married    Spouse name: Not on file   Number of children: Not on file   Years of education: Not on file   Highest education level: Not on file  Occupational History   Not on file  Tobacco Use   Smoking status: Former    Packs/day: 0.30    Years: 10.00    Total pack years: 3.00    Types: Cigarettes    Start date: 08/10/1980    Quit date: 05/02/2000    Years since quitting: 21.8   Smokeless tobacco: Never  Vaping Use   Vaping Use: Never used  Substance and Sexual Activity   Alcohol use: No    Alcohol/week: 0.0 standard drinks of alcohol   Drug use: No   Sexual activity: Yes    Partners: Female  Other Topics Concern   Not on file  Social History Narrative   Not on file   Social Determinants of Health   Financial Resource Strain: Not on file  Food Insecurity: Not on file  Transportation Needs: Not on file  Physical Activity: Not on file  Stress: Not on file  Social Connections: Not on file     Family History: The patient's family history includes CAD in her sister;  Diabetes Mellitus II in her mother; Heart failure in her mother.  ROS:   Please see the history of present illness.  All other systems reviewed and are negative.  Labs/Other Studies Reviewed:    The following studies were reviewed today:   Cardiac catheterization 04/28/2014: Hemodynamics: AO 118/52 LV 118/7   Coronary angiography: Coronary dominance: right   Left mainstem: The left main stem is short. The vessel was calcified. The left main is widely patent without obstruction.   Left anterior descending (LAD): The LAD is severely calcified, especially in the midportion of the vessel. The proximal LAD is patent with scattered 20% stenoses. The diagonal branches are patent with mild ostial stenosis. The mid LAD has 50% stenosis. The LAD  reaches the LV apex. There is no high-grade obstructive disease identified throughout the LAD distribution.   Left circumflex (LCx): The left circumflex is patent. There are 3 obtuse marginal branches present. The mid AV circumflex just after the second obtuse marginal has a 50-60% stenosis. The OM branches are small and diffusely diseased. The third OM is the largest of the obtuse marginal branches.   Right coronary artery (RCA): The RCA is diffusely diseased through its midportion. There is diffuse 30-40% stenosis present. The PDA and PLA branches are patent without significant stenosis. There is diffuse calcification of the proximal and mid RCA.   Left ventriculography: Left ventricular systolic function is normal, LVEF is estimated at 65%, there is no significant mitral regurgitation    Estimated Blood Loss: Minimal   Final Conclusions:    1. Mild to moderate diffuse coronary artery disease   2. Normal LV systolic function  Recent Labs: From PCP 09/20/21 WBC 6.6, Hgb 12.9, PLT 464 Creatinine 0.82, Na 138, K 4.2 AST 26, ALT 26 A1C 7.6  Recent Lipid Panel    Component Value Date/Time   CHOL 175 04/15/2020 1557   TRIG 342 (H) 04/15/2020  1557   HDL 33 (L) 04/15/2020 1557   CHOLHDL 5.3 (H) 04/15/2020 1557   LDLCALC 96 04/15/2020 1557     Risk Assessment/Calculations:      Physical Exam:    VS:  BP (!) 142/78   Pulse 68   Ht 5\' 6"  (1.676 m)   Wt 178 lb 3.2 oz (80.8 kg)   SpO2 94%   BMI 28.76 kg/m     Wt Readings from Last 3 Encounters:  02/24/22 178 lb 3.2 oz (80.8 kg)  01/25/22 175 lb (79.4 kg)  05/07/21 186 lb 6.4 oz (84.6 kg)     GEN:  Well nourished, well developed in no acute distress HEENT: Normal NECK: No JVD; No carotid bruits CARDIAC: RRR, no murmurs, rubs, gallops RESPIRATORY:  Clear to auscultation without rales, wheezing or rhonchi  ABDOMEN: Soft, non-tender, non-distended MUSCULOSKELETAL:  No edema; No deformity. 2+ pedal pulses, equal bilaterally SKIN: Warm and dry NEUROLOGIC:  Alert and oriented x 3 PSYCHIATRIC:  Normal affect   EKG:  EKG is not ordered today  Diagnoses:    1. Coronary artery disease involving native coronary artery of native heart without angina pectoris   2. Hyperlipidemia LDL goal <70   3. Left bundle branch block   4. Gastroesophageal reflux disease, unspecified whether esophagitis present   5. Essential hypertension     Assessment and Plan:     CAD without angina: Cardiac catheterization 04/2014 revealed calcified left main, widely patent without obstruction, proximal LAD scattered 20% stenosis, mild ostial stenosis, mLAD 50% stenosis, no high-grade obstructive disease throughout LAD distribution. Mid AV circumflex just above the second obtuse marginal 50 to 60% stenosis, OM branches are small and diffusely diseased, RCA diffusely diseased through midportion, diffuse 30 to 40% stenosis present, diffuse calcification of proximal and mid RCA, normal LVEF. Episode of chest pain on 02/03/22 relieved with ntg. Had been on higher dose of Imdur just a few days. Symptoms occurred after eating. Agrees that anxiety may be contributing. Continue to use SL NTG as needed.  Continue aspirin, enalapril, isosorbide, metoprolol, rosuvastatin.  GERD: Symptoms of chest pressure worsen with a full stomach. Recently started Pepcid. No recurrence since starting, but it has been a short time.  She will consider referral to GI but would prefer to wait until better insurance coverage in  2024.  Hyperlipidemia LDL goal < 70: Elevated coronary calcium score 281 in 2015. LDL and triglycerides elevated at PCP visit per patient report. Was advised to restart fenofibrate in addition to rosuvastatin. We have called to request lab results which are at least 47 months old. I have asked her to have repeat fasting lipid panel. She will contact PCP office to get labs there.   LBBB: Chronic. No presyncope, syncope, dyspnea, orthopnea, edema to suggest worsening LV function. Consideration of repeating echocardiogram, however she would like to wait until next year when she will have better insurance coverage. Will consider at next office visit since she asymptomatic.   Hypertension: BP is elevated. Reports similar elevated home readings. We will add HCTZ 12.5 mg daily. Potassium 4.2 in May 2023 and she eats a lot of high potassium foods. We will recheck BMET in 2 weeks. Advised her to notify us if BP does not improve.   Disposition: 6 months with Dr. Domenic Polite  Medication Adjustments/Labs and Tests Ordered: Current medicines are reviewed at length with the patient today.  Concerns regarding medicines are outlined above.  No orders of the defined types were placed in this encounter.  Meds ordered this encounter  Medications   hydrochlorothiazide (MICROZIDE) 12.5 MG capsule    Sig: Take 1 capsule (12.5 mg total) by mouth daily.    Dispense:  90 capsule    Refill:  3    Patient Instructions  Medication Instructions:   START HCTZ one (1) tablet by mouth (12.5 mg) daily.   *If you need a refill on your cardiac medications before your next appointment, please call your pharmacy*   Lab  Work:  Patient will call PCP to get lab work and flu shot.   If you have labs (blood work) drawn today and your tests are completely normal, you will receive your results only by: [place] (if you have MyChart) OR A paper copy in the mail If you have any lab test that is abnormal or we need to change your treatment, we will call you to review the results.   Testing/Procedures:  None ordered.   Follow-Up: At [facility], you and your health needs are our priority.  As part of our continuing mission to provide you with exceptional heart care, we have created designated Provider Care Teams.  These Care Teams include your primary Cardiologist (physician) and Advanced Practice Providers (APPs -  Physician Assistants and Nurse Practitioners) who all work together to provide you with the care you need, when you need it.  We recommend signing up for the patient portal called "MyChart".  Sign up information is provided on this After Visit Summary.  MyChart is used to connect with patients for Virtual Visits (Telemedicine).  Patients are able to view lab/test results, encounter notes, upcoming appointments, etc.  Non-urgent messages can be sent to your provider as well.   To learn more about what you can do with MyChart, go to [email or web address].    Your next appointment:   6 month(s)  The format for your next appointment:   In Person  Provider:   You may see Rozann Lesches, MD or the following Advanced Practice Provider on your designated Care Team:     Important Information About Sugar         Signed, Emmaline Life, NP  02/24/2022 5:39 PM    [place]

## 2022-02-24 ENCOUNTER — Ambulatory Visit: Payer: BC Managed Care – PPO | Attending: Nurse Practitioner | Admitting: Nurse Practitioner

## 2022-02-24 ENCOUNTER — Encounter: Payer: Self-pay | Admitting: Nurse Practitioner

## 2022-02-24 ENCOUNTER — Telehealth: Payer: Self-pay | Admitting: *Deleted

## 2022-02-24 VITALS — BP 142/78 | HR 68 | Ht 66.0 in | Wt 178.2 lb

## 2022-02-24 DIAGNOSIS — I447 Left bundle-branch block, unspecified: Secondary | ICD-10-CM

## 2022-02-24 DIAGNOSIS — E785 Hyperlipidemia, unspecified: Secondary | ICD-10-CM | POA: Diagnosis not present

## 2022-02-24 DIAGNOSIS — I10 Essential (primary) hypertension: Secondary | ICD-10-CM

## 2022-02-24 DIAGNOSIS — I2510 Atherosclerotic heart disease of native coronary artery without angina pectoris: Secondary | ICD-10-CM

## 2022-02-24 DIAGNOSIS — K219 Gastro-esophageal reflux disease without esophagitis: Secondary | ICD-10-CM | POA: Diagnosis not present

## 2022-02-24 MED ORDER — HYDROCHLOROTHIAZIDE 12.5 MG PO CAPS: 12.5000 mg | ORAL_CAPSULE | Freq: Every day | ORAL | 3 refills | Status: DC

## 2022-02-24 NOTE — Telephone Encounter (Signed)
S/w Pt's PCP office at [phone] to get pt's last labs

## 2022-02-24 NOTE — Patient Instructions (Signed)
Medication Instructions:   START HCTZ one (1) tablet by mouth (12.5 mg) daily.   *If you need a refill on your cardiac medications before your next appointment, please call your pharmacy*   Lab Work:  Patient will call PCP to get lab work and flu shot.   If you have labs (blood work) drawn today and your tests are completely normal, you will receive your results only by: [place] (if you have MyChart) OR A paper copy in the mail If you have any lab test that is abnormal or we need to change your treatment, we will call you to review the results.   Testing/Procedures:  None ordered.   Follow-Up: At [facility], you and your health needs are our priority.  As part of our continuing mission to provide you with exceptional heart care, we have created designated Provider Care Teams.  These Care Teams include your primary Cardiologist (physician) and Advanced Practice Providers (APPs -  Physician Assistants and Nurse Practitioners) who all work together to provide you with the care you need, when you need it.  We recommend signing up for the patient portal called "MyChart".  Sign up information is provided on this After Visit Summary.  MyChart is used to connect with patients for Virtual Visits (Telemedicine).  Patients are able to view lab/test results, encounter notes, upcoming appointments, etc.  Non-urgent messages can be sent to your provider as well.   To learn more about what you can do with MyChart, go to [email or web address].    Your next appointment:   6 month(s)  The format for your next appointment:   In Person  Provider:   You may see Rozann Lesches, MD or the following Advanced Practice Provider on your designated Care Team:     Important Information About Sugar

## 2022-02-24 NOTE — Telephone Encounter (Signed)
Received labs from PCP.

## 2022-03-11 ENCOUNTER — Telehealth: Payer: Self-pay | Admitting: *Deleted

## 2022-03-11 NOTE — Telephone Encounter (Signed)
-----   Message from Levi Aland, NP sent at 03/10/2022  2:18 PM EST ----- I started her on HCTZ 10/27 and asked her to get labs in 2 weeks. She was going to see her PCP to get labs. Would you please check in with her to see if she has done that and if not, we need a bmet.  ----- Message ----- From: Levi Aland, NP Sent: 03/10/2022  10:00 AM EST To: Levi Aland, NP  Check on her labs

## 2022-03-11 NOTE — Telephone Encounter (Signed)
S/w pt's husband per (DPR). Pt did get labs drawn at PCP. S/w Tammy @ [phone] at PCP. Pt had BMET/LIPID/CBC on 11/2.  Asked to fax to office.  Will send to [facility] to [facility]

## 2022-03-17 NOTE — Telephone Encounter (Signed)
S/w pt is aware of lab results and recommendations.

## 2022-03-17 NOTE — Telephone Encounter (Signed)
Received labs from primary care Creatinine 1.03 Na 137 K+ 3.9 LDL cholesterol 74, HDL 31, triglycerides 185  Goal for cholesterol is < 70. She is close. Continue to work on increasing intake of whole foods including whole grains, fruit, vegetables, lean protein, and avoid processed and friend foods and foods high in sugar.   Continue current medications.

## 2022-04-11 ENCOUNTER — Other Ambulatory Visit: Payer: Self-pay | Admitting: Cardiology

## 2022-08-10 DIAGNOSIS — R079 Chest pain, unspecified: Secondary | ICD-10-CM | POA: Diagnosis not present

## 2022-08-10 DIAGNOSIS — Z743 Need for continuous supervision: Secondary | ICD-10-CM | POA: Diagnosis not present

## 2022-08-11 ENCOUNTER — Telehealth: Payer: Self-pay | Admitting: Cardiology

## 2022-08-11 DIAGNOSIS — I2510 Atherosclerotic heart disease of native coronary artery without angina pectoris: Secondary | ICD-10-CM | POA: Diagnosis not present

## 2022-08-11 DIAGNOSIS — Z9889 Other specified postprocedural states: Secondary | ICD-10-CM | POA: Diagnosis not present

## 2022-08-11 DIAGNOSIS — R079 Chest pain, unspecified: Secondary | ICD-10-CM | POA: Diagnosis not present

## 2022-08-11 DIAGNOSIS — J811 Chronic pulmonary edema: Secondary | ICD-10-CM | POA: Diagnosis not present

## 2022-08-11 DIAGNOSIS — Z7982 Long term (current) use of aspirin: Secondary | ICD-10-CM | POA: Diagnosis not present

## 2022-08-11 DIAGNOSIS — Z01810 Encounter for preprocedural cardiovascular examination: Secondary | ICD-10-CM | POA: Diagnosis not present

## 2022-08-11 DIAGNOSIS — Z7901 Long term (current) use of anticoagulants: Secondary | ICD-10-CM | POA: Diagnosis not present

## 2022-08-11 DIAGNOSIS — I2089 Other forms of angina pectoris: Secondary | ICD-10-CM | POA: Diagnosis not present

## 2022-08-11 DIAGNOSIS — E109 Type 1 diabetes mellitus without complications: Secondary | ICD-10-CM | POA: Diagnosis not present

## 2022-08-11 DIAGNOSIS — I110 Hypertensive heart disease with heart failure: Secondary | ICD-10-CM | POA: Diagnosis not present

## 2022-08-11 DIAGNOSIS — Z7902 Long term (current) use of antithrombotics/antiplatelets: Secondary | ICD-10-CM | POA: Diagnosis not present

## 2022-08-11 DIAGNOSIS — J90 Pleural effusion, not elsewhere classified: Secondary | ICD-10-CM | POA: Diagnosis not present

## 2022-08-11 DIAGNOSIS — Z87891 Personal history of nicotine dependence: Secondary | ICD-10-CM | POA: Diagnosis not present

## 2022-08-11 DIAGNOSIS — R0603 Acute respiratory distress: Secondary | ICD-10-CM | POA: Diagnosis not present

## 2022-08-11 DIAGNOSIS — I10 Essential (primary) hypertension: Secondary | ICD-10-CM | POA: Diagnosis not present

## 2022-08-11 DIAGNOSIS — R7989 Other specified abnormal findings of blood chemistry: Secondary | ICD-10-CM | POA: Diagnosis not present

## 2022-08-11 DIAGNOSIS — I5042 Chronic combined systolic (congestive) and diastolic (congestive) heart failure: Secondary | ICD-10-CM | POA: Diagnosis not present

## 2022-08-11 DIAGNOSIS — I25110 Atherosclerotic heart disease of native coronary artery with unstable angina pectoris: Secondary | ICD-10-CM | POA: Diagnosis not present

## 2022-08-11 DIAGNOSIS — I25118 Atherosclerotic heart disease of native coronary artery with other forms of angina pectoris: Secondary | ICD-10-CM | POA: Diagnosis not present

## 2022-08-11 DIAGNOSIS — E119 Type 2 diabetes mellitus without complications: Secondary | ICD-10-CM | POA: Diagnosis not present

## 2022-08-11 DIAGNOSIS — J984 Other disorders of lung: Secondary | ICD-10-CM | POA: Diagnosis not present

## 2022-08-11 DIAGNOSIS — Z794 Long term (current) use of insulin: Secondary | ICD-10-CM | POA: Diagnosis not present

## 2022-08-11 DIAGNOSIS — R0602 Shortness of breath: Secondary | ICD-10-CM | POA: Diagnosis not present

## 2022-08-11 DIAGNOSIS — Z8249 Family history of ischemic heart disease and other diseases of the circulatory system: Secondary | ICD-10-CM | POA: Diagnosis not present

## 2022-08-11 DIAGNOSIS — Z951 Presence of aortocoronary bypass graft: Secondary | ICD-10-CM | POA: Diagnosis not present

## 2022-08-11 DIAGNOSIS — I447 Left bundle-branch block, unspecified: Secondary | ICD-10-CM | POA: Diagnosis not present

## 2022-08-11 DIAGNOSIS — I5020 Unspecified systolic (congestive) heart failure: Secondary | ICD-10-CM | POA: Diagnosis not present

## 2022-08-11 DIAGNOSIS — D62 Acute posthemorrhagic anemia: Secondary | ICD-10-CM | POA: Diagnosis not present

## 2022-08-11 DIAGNOSIS — J810 Acute pulmonary edema: Secondary | ICD-10-CM | POA: Diagnosis not present

## 2022-08-11 DIAGNOSIS — J9601 Acute respiratory failure with hypoxia: Secondary | ICD-10-CM | POA: Diagnosis not present

## 2022-08-11 DIAGNOSIS — Z720 Tobacco use: Secondary | ICD-10-CM | POA: Diagnosis not present

## 2022-08-11 DIAGNOSIS — I200 Unstable angina: Secondary | ICD-10-CM | POA: Diagnosis not present

## 2022-08-11 DIAGNOSIS — I5021 Acute systolic (congestive) heart failure: Secondary | ICD-10-CM | POA: Diagnosis not present

## 2022-08-11 DIAGNOSIS — I739 Peripheral vascular disease, unspecified: Secondary | ICD-10-CM | POA: Diagnosis not present

## 2022-08-11 DIAGNOSIS — E1065 Type 1 diabetes mellitus with hyperglycemia: Secondary | ICD-10-CM | POA: Diagnosis not present

## 2022-08-11 DIAGNOSIS — I214 Non-ST elevation (NSTEMI) myocardial infarction: Secondary | ICD-10-CM | POA: Diagnosis not present

## 2022-08-11 DIAGNOSIS — E785 Hyperlipidemia, unspecified: Secondary | ICD-10-CM | POA: Diagnosis not present

## 2022-08-11 DIAGNOSIS — Z48812 Encounter for surgical aftercare following surgery on the circulatory system: Secondary | ICD-10-CM | POA: Diagnosis not present

## 2022-08-11 NOTE — Telephone Encounter (Signed)
Patient calling back. She says to call her at: [phone]

## 2022-08-11 NOTE — Telephone Encounter (Signed)
Patient informed and verbalized understanding of plan. 

## 2022-08-11 NOTE — Telephone Encounter (Signed)
Pt states she is in the hospital at [facility]. She states she is currently waiting to have an echo. Pt says that the hospital is telling her that she has had a heart attack and that there is fluid in her lungs. She states that the hospital is also saying that she may even have to have a cath. Pt does not want to have that done there. She is wanting advise from Dr. Diona Browner on if he thinks she would be ok driving home, and then having it done here. Pt states that she can be called at her husbands phone, [phone]. Please advise.

## 2022-08-25 ENCOUNTER — Encounter: Payer: Self-pay | Admitting: Nurse Practitioner

## 2022-08-25 ENCOUNTER — Ambulatory Visit: Payer: Medicare HMO | Attending: Nurse Practitioner | Admitting: Nurse Practitioner

## 2022-08-25 VITALS — BP 130/60 | HR 62 | Ht 65.0 in | Wt 176.8 lb

## 2022-08-25 DIAGNOSIS — Z951 Presence of aortocoronary bypass graft: Secondary | ICD-10-CM

## 2022-08-25 DIAGNOSIS — M25472 Effusion, left ankle: Secondary | ICD-10-CM

## 2022-08-25 DIAGNOSIS — I2510 Atherosclerotic heart disease of native coronary artery without angina pectoris: Secondary | ICD-10-CM | POA: Diagnosis not present

## 2022-08-25 DIAGNOSIS — E785 Hyperlipidemia, unspecified: Secondary | ICD-10-CM | POA: Diagnosis not present

## 2022-08-25 DIAGNOSIS — Z79899 Other long term (current) drug therapy: Secondary | ICD-10-CM

## 2022-08-25 DIAGNOSIS — I10 Essential (primary) hypertension: Secondary | ICD-10-CM | POA: Diagnosis not present

## 2022-08-25 NOTE — Patient Instructions (Addendum)
Medication Instructions:  Your physician recommends that you continue on your current medications as directed. Please refer to the Current Medication list given to you today.   Labwork: CBC, BMET, BNP, MAG - due in 1 week (by 5/2) @ [facility]  Testing/Procedures: none  Follow-Up:  Your physician recommends that you schedule a follow-up appointment in: 3 months  Any Other Special Instructions Will Be Listed Below (If Applicable).  If you need a refill on your cardiac medications before your next appointment, please call your pharmacy.

## 2022-08-25 NOTE — Progress Notes (Signed)
Office Visit    Patient Name: Suzanne Tapia Date of Encounter: 08/25/2022  PCP:  Rebekah Chesterfield, NP   [facility]  Cardiologist:  Nona Dell, MD  Advanced Practice Provider:  No care team member to display Electrophysiologist:  None   Chief Complaint    Suzanne Tapia is a 67 y.o. female with a hx of palpitations, CAD, s/p CABG and stenting, hyperlipidemia, left bundle branch block, history of chest pain, type 1 diabetes, and hypertension, who presents today for hospital follow-up.   Past Medical History    Past Medical History:  Diagnosis Date   CAD (coronary artery disease)    Mild to moderate by cardiac catheterization December 2015   Essential hypertension    History of palpitations    Negative CardioNet monitor 2012   Mixed hyperlipidemia    Type 2 diabetes mellitus (HCC)    Past Surgical History:  Procedure Laterality Date   BILATERAL SALPINGOOPHORECTOMY     BREAST LUMPECTOMY Bilateral    CARPAL TUNNEL RELEASE Left    LEFT HEART CATHETERIZATION WITH CORONARY ANGIOGRAM N/A 04/28/2014   Procedure: LEFT HEART CATHETERIZATION WITH CORONARY ANGIOGRAM;  Surgeon: Micheline Chapman, MD;  [facility] CATH LAB;  Service: Cardiovascular;  Laterality: N/A;    Allergies  Allergies  Allergen Reactions   Sulfa Antibiotics Other (See Comments)    REACTION: hyperactivity    History of Present Illness    Suzanne Tapia is a 67 y.o. female with a PMH as mentioned above.  Underwent diagnostic cardiac cath for accelerating angina in 2015 that revealed mild to moderate CAD, normal LV function.  Seen by Eligha Bridegroom, NP on 12/2021 for worsening angina.  [place] the beach a [place] prior when she had the most severe episode of chest pain.  Took a nitroglycerin, symptoms resolved.  Symptoms returned after moving around, reported taking several nitroglycerin over the span of several hours.  Also noted shortness of breath with exertion.  At the  office visit, was advised to increase Imdur to 60 mg daily.  She noted some episodes of acid reflux, was advised to try Gaviscon or Pepcid.  Last seen by Eligha Bridegroom, NP for follow-up on 01/2022.  She noted an episode of chest pain after eating.  Symptoms relieved with nitroglycerin.  Stated anxiety may exacerbate her symptoms.  Took several sublingual nitroglycerin when neighbor was found deceased, as she thought her emotional upset would make her chest pain worse.  Denied any associated symptoms.  HCTZ added to medication regimen to help improve BP.  Today she presents for follow-up with her husband. She states she was recently discharged from [facility] in [facility] [place].  She underwent cardiac catheterization that ultimately required CABG surgery on August 16, 2022.  Post CABG surgery, she returned back to the Cath Lab to receive stents.  I do not have these records currently.  I did review her AVS that did show diagnosis of acute pulmonary edema that must of occurred during hospitalization, as well as NSTEMI and elevated troponins. She returns back to receive medical care close to home.  Husband has question regarding getting a chest x-ray done locally.  Patient is overall doing well since surgery.  She does have weakness and generalized musculoskeletal soreness, denies any chest pain.  Tolerating her medications well. Denies any chest pain, shortness of breath, palpitations, syncope, presyncope, dizziness, orthopnea, PND, swelling or significant weight changes, acute bleeding, or claudication.  EKGs/Labs/Other Studies Reviewed:   The following studies were reviewed today:   EKG:  EKG is not ordered today.     Recent Labs: No results found for requested labs within last 365 days.  Recent Lipid Panel    Component Value Date/Time   CHOL 175 04/15/2020 1557   TRIG 342 (H) 04/15/2020 1557   HDL 33 (L) 04/15/2020 1557   CHOLHDL 5.3 (H) 04/15/2020 1557    LDLCALC 96 04/15/2020 1557     Home Medications   Current Meds  Medication Sig   Ascorbic Acid (VITAMIN C PO) Take 1 tablet by mouth daily.   aspirin EC 81 MG tablet Take 81 mg by mouth daily.   atorvastatin (LIPITOR) 80 MG tablet Take 80 mg by mouth daily.   clopidogrel (PLAVIX) 75 MG tablet Take 75 mg by mouth daily.   escitalopram (LEXAPRO) 10 MG tablet Take 5 mg by mouth daily.   famotidine (PEPCID) 20 MG tablet Take 20 mg by mouth daily.   fenofibrate 160 MG tablet Take 160 mg by mouth daily.   furosemide (LASIX) 20 MG tablet Take 20 mg by mouth daily.   Insulin Glargine Solostar (LANTUS) 100 UNIT/ML Solostar Pen Inject 110 Units into the skin daily.   loratadine (CLARITIN) 10 MG tablet Take 10 mg by mouth daily.   metoprolol tartrate (LOPRESSOR) 25 MG tablet Take 25 mg by mouth 2 (two) times daily.   Multiple Vitamins-Minerals (ZINC PO) Take 1 tablet by mouth daily.   nitroGLYCERIN (NITROSTAT) 0.4 MG SL tablet Place 1 tablet (0.4 mg total) under the tongue every 5 (five) minutes x 3 doses as needed for chest pain (If no relief after 2nd dose, proceed to the ED for an evaluation). U   NOVOLOG 100 UNIT/ML injection Inject into the skin. 55 units in am and 60 in pm   SALINE NASAL MIST NA Place 2 sprays into the nose as needed.   traMADol (ULTRAM) 50 MG tablet Take 50 mg by mouth every 6 (six) hours as needed.     Review of Systems    All other systems reviewed and are otherwise negative except as noted above.  Physical Exam    VS:  BP 130/60 (BP Location: Right Arm, Patient Position: Sitting, Cuff Size: Normal)   Pulse 62   Ht 5\' 5"  (1.651 m)   Wt 176 lb 12.8 oz (80.2 kg)   SpO2 96%   BMI 29.42 kg/m  , BMI Body mass index is 29.42 kg/m.  Wt Readings from Last 3 Encounters:  08/25/22 176 lb 12.8 oz (80.2 kg)  02/24/22 178 lb 3.2 oz (80.8 kg)  01/25/22 175 lb (79.4 kg)     GEN: Well nourished, well developed, in no acute distress. HEENT: normal. Neck: Supple, no JVD,  carotid bruits, or masses. Cardiac: S1/S2, RRR, no murmurs, rubs, or gallops. No clubbing, cyanosis.  Trace ankle edema along left side, no ankle edema along right side.  Radials/PT 2+ and equal bilaterally.  Respiratory:  Respirations regular and unlabored, clear to auscultation bilaterally. MS: No deformity or atrophy. Skin: Warm and dry, no rash.  Sternal incision is healing well and approximated, slight focal edema/notch superior to sternal incision, stable per patient's report. Neuro:  Strength and sensation are intact. Psych: Normal affect.  Assessment & Plan    CAD, s/p CABG and stenting, medication management Overall doing well after returning home from [facility].  I do not have current records, but will request that this is  sent over to [place].  Will obtain the following labs within the following week: CBC, BMP, proBNP, and magnesium. Denies needing any medication refills.  I consulted 2 separate cardiothoracic surgery PAs who stated patient does not need to be seen in [place] office at this time.  Our office [facility] call [facility] stating a 2 view chest x-ray needs to be arranged to be performed within the following week.  We will arrange this - see separate telephone note.  Continue aspirin, atorvastatin, Plavix, fenofibrate, Imdur, Lopressor, and nitroglycerin as needed.  Okay to start cardiac rehab.  Hyperlipidemia Denies any issues.  Continue atorvastatin.  Plan to obtain lab work at next office visit as this was not addressed at this office visit.  Recommend obtaining FLP and LFT at that time. Heart healthy diet and regular cardiovascular exercise encouraged.   Ankle swelling Nonpitting, trace edema noted to left ankle, none noted along right ankle since leaving hospital. Denies any injuries. Obtaining labs as mentioned above.  Continue Lasix 20 mg daily. Low salt, heart healthy diet and regular cardiovascular exercise encouraged.   HTN, medication  management Blood pressure stable.  No medication changes at this time. Obtaining labs. Discussed to monitor BP at home at least 2 hours after medications and sitting for 5-10 minutes. Heart healthy diet and regular cardiovascular exercise encouraged.    Disposition: Follow up in 3 month(s) with Nona Dell, MD or APP.  Signed, Sharlene Dory, NP 08/26/2022, 12:17 PM [facility]

## 2022-08-26 ENCOUNTER — Telehealth: Payer: Self-pay | Admitting: Cardiology

## 2022-08-26 ENCOUNTER — Telehealth: Payer: Self-pay

## 2022-08-26 ENCOUNTER — Ambulatory Visit (HOSPITAL_COMMUNITY)
Admission: RE | Admit: 2022-08-26 | Discharge: 2022-08-26 | Disposition: A | Discharge status: Home / Self Care | Payer: Medicare HMO | Source: Ambulatory Visit | Attending: Nurse Practitioner | Admitting: Nurse Practitioner

## 2022-08-26 DIAGNOSIS — Z951 Presence of aortocoronary bypass graft: Secondary | ICD-10-CM | POA: Diagnosis not present

## 2022-08-26 DIAGNOSIS — J9811 Atelectasis: Secondary | ICD-10-CM | POA: Diagnosis not present

## 2022-08-26 DIAGNOSIS — J90 Pleural effusion, not elsewhere classified: Secondary | ICD-10-CM | POA: Diagnosis not present

## 2022-08-26 NOTE — Telephone Encounter (Signed)
Office is asking the nurse to give a call back regarding the patient. Please advise

## 2022-08-26 NOTE — Telephone Encounter (Signed)
Order placed and sent to [facility] for scheduling.

## 2022-08-26 NOTE — Telephone Encounter (Signed)
Medical Records (Echo, Heart Cath, Open Heart Surgery Report) requested from [facility], [facility].

## 2022-08-26 NOTE — Telephone Encounter (Signed)
Order for 2 view CXR placed, per Sharlene Dory, NP

## 2022-08-26 NOTE — Telephone Encounter (Signed)
Returned call to [employer] who is the PA there at [facility] - they are requesting that patient has chest x-ray done next week.  States they usually recommend one 3 weeks out from surgery to check sternal wires, but she will not be following up there any longer.  She does not live there & wants all of her care here in [place].   Therefore they can not give the order and are requesting [place] order so we can follow up on this.

## 2022-08-26 NOTE — Telephone Encounter (Signed)
Patient made aware that a STAT order for a 2 View CXR has been placed at [facility]. States that she will go after lunch today to have this done.

## 2022-08-30 DIAGNOSIS — I63423 Cerebral infarction due to embolism of bilateral anterior cerebral arteries: Secondary | ICD-10-CM | POA: Diagnosis not present

## 2022-08-30 DIAGNOSIS — R0683 Snoring: Secondary | ICD-10-CM | POA: Diagnosis not present

## 2022-08-30 DIAGNOSIS — Z79899 Other long term (current) drug therapy: Secondary | ICD-10-CM | POA: Diagnosis not present

## 2022-08-31 ENCOUNTER — Telehealth: Payer: Self-pay | Admitting: *Deleted

## 2022-08-31 DIAGNOSIS — Z79899 Other long term (current) drug therapy: Secondary | ICD-10-CM

## 2022-08-31 DIAGNOSIS — Z951 Presence of aortocoronary bypass graft: Secondary | ICD-10-CM

## 2022-08-31 DIAGNOSIS — I2510 Atherosclerotic heart disease of native coronary artery without angina pectoris: Secondary | ICD-10-CM

## 2022-08-31 DIAGNOSIS — J90 Pleural effusion, not elsewhere classified: Secondary | ICD-10-CM

## 2022-08-31 MED ORDER — POTASSIUM CHLORIDE ER 10 MEQ PO TBCR: 10.0000 meq | EXTENDED_RELEASE_TABLET | Freq: Every day | ORAL | 1 refills | Status: DC

## 2022-08-31 MED ORDER — FUROSEMIDE 40 MG PO TABS: 40.0000 mg | ORAL_TABLET | Freq: Every day | ORAL | 2 refills | Status: DC

## 2022-08-31 NOTE — Telephone Encounter (Signed)
Patient informed and verbalized understanding of plan. Lab order faxed to [facility]. 

## 2022-08-31 NOTE — Addendum Note (Signed)
Addended by: Eustace Moore on: 08/31/2022 03:45 PM   Modules accepted: Orders

## 2022-08-31 NOTE — Telephone Encounter (Signed)
Patient informed and verbalized understanding of plan. Reports lab work was done yesterday Medications reviewed and profile updated

## 2022-08-31 NOTE — Telephone Encounter (Signed)
-----   Message from Sharlene Dory, NP sent at 08/30/2022 11:11 PM EDT ----- Does have enlarged heart and moderate pleural effusion on left side. I ordered labs at last OV, but looks like this has not been completed. Let's see if these can be done, then will see how much to increase Lasix.   Thanks!   Best, Sharlene Dory, AGNP-C

## 2022-09-02 NOTE — Telephone Encounter (Signed)
Contacted and patient was asleep per husband John. Patient's husband informed and verbalized understanding of plan. Will have patient call back to schedule 2-3 week f/u with Philis Nettle.

## 2022-09-02 NOTE — Telephone Encounter (Signed)
Patient informed and verbalized understanding of plan. 

## 2022-09-02 NOTE — Telephone Encounter (Signed)
Patient is returning call.  °

## 2022-09-02 NOTE — Addendum Note (Signed)
Addended by: Eustace Moore on: 09/02/2022 01:32 PM   Modules accepted: Orders

## 2022-09-02 NOTE — Telephone Encounter (Signed)
   Pt is returning call, she said, she will be out doing errands and to call her after lunch

## 2022-09-05 ENCOUNTER — Other Ambulatory Visit: Payer: Self-pay | Admitting: Cardiology

## 2022-09-05 ENCOUNTER — Ambulatory Visit: Payer: Medicare HMO | Attending: Internal Medicine

## 2022-09-05 DIAGNOSIS — I2510 Atherosclerotic heart disease of native coronary artery without angina pectoris: Secondary | ICD-10-CM

## 2022-09-05 DIAGNOSIS — Z79899 Other long term (current) drug therapy: Secondary | ICD-10-CM

## 2022-09-05 DIAGNOSIS — J90 Pleural effusion, not elsewhere classified: Secondary | ICD-10-CM

## 2022-09-05 DIAGNOSIS — Z951 Presence of aortocoronary bypass graft: Secondary | ICD-10-CM

## 2022-09-05 MED ORDER — PERFLUTREN LIPID MICROSPHERE
1.0000 mL | INTRAVENOUS | Status: AC | PRN
Start: 2022-09-05 — End: 2022-09-05
  Administered 2022-09-05: 2 mL via INTRAVENOUS

## 2022-09-07 ENCOUNTER — Telehealth: Payer: Self-pay | Admitting: *Deleted

## 2022-09-07 LAB — ECHOCARDIOGRAM COMPLETE
AR max vel: 1.99 cm2
AV Area VTI: 2.05 cm2
AV Area mean vel: 2 cm2
AV Mean grad: 4 mmHg
AV Peak grad: 8.4 mmHg
Ao pk vel: 1.45 m/s
Area-P 1/2: 3.16 cm2
Calc EF: 46.7 %
MV M vel: 3.25 m/s
MV Peak grad: 42.3 mmHg
P 1/2 time: 1223 msec
S' Lateral: 3.5 cm
Single Plane A2C EF: 48.2 %
Single Plane A4C EF: 46.6 %

## 2022-09-07 NOTE — Telephone Encounter (Signed)
Lesle Chris, LPN 05/07/1094  0:45 PM EDT Back to Top    Notified, copy to pcp.  New prescription sent to [facility] now.  States she is doing her chest x-ray prior to her upcoming appointment.      agree with that suggestion regarding her GDMT, based on her recent labs and BP. We will initiate those medications as you have suggested.   Looks like she has an appointment with me in less than 2 weeks from now and I will discuss further GDMT titration at that next office visit. Agree with you that this will all depend on her renal function and BP trends. Will also discuss repeating CXR at that visit for her previously reported moderate left side pleural effusion and then if resolution, discuss cardiac rehab. Will see what upcoming BMET shows.   Thanks!   Best,  Sharlene Dory, NP

## 2022-09-07 NOTE — Telephone Encounter (Signed)
-----   Message from Jonelle Sidle, MD sent at 09/07/2022  9:54 AM EDT ----- Results reviewed.  Echocardiogram shows LV dysfunction, not certain about baseline at outside hospital but now moderately reduced at approximately 35% LVEF per my review the images.  Suggest switching from Lopressor to Coreg 6.25 mg twice daily.  Would likely also go ahead and add either Jardiance or Farxiga 10 mg daily.  Additional GDMT will largely hinge on blood pressure and also subsequent renal function and she diuresis further.  Please make sure that she has follow-up pending with Ms. Philis Nettle NP.

## 2022-09-08 DIAGNOSIS — Z79899 Other long term (current) drug therapy: Secondary | ICD-10-CM | POA: Diagnosis not present

## 2022-09-08 NOTE — Telephone Encounter (Signed)
Patient states that she prefers to wait on starting the Coreg until she has OV on 09/19/2022 with Sharlene Dory, NP.  States she is just feeling uneasy about changing things till she speak with her again.  She did her labs today and will be doing her chest x-ray on 09/15/2022.

## 2022-09-08 NOTE — Telephone Encounter (Signed)
Pt states she is returning call  

## 2022-09-13 ENCOUNTER — Telehealth: Payer: Self-pay | Admitting: Cardiology

## 2022-09-13 MED ORDER — FUROSEMIDE 40 MG PO TABS: 20.0000 mg | ORAL_TABLET | Freq: Every day | ORAL | 2 refills | Status: DC

## 2022-09-13 NOTE — Telephone Encounter (Signed)
-----   Message from Jonelle Sidle, MD sent at 09/13/2022  9:29 AM EDT ----- Results reviewed.  Follow-up lab work shows normal potassium at 4.2, BUN 27, and creatinine up to 1.4 from 1.22.  Would suggest cutting Lasix back to 20 mg daily for now, continue potassium supplement.  She has a follow-up visit soon with Ms. Peck NP as well as repeat chest x-ray.

## 2022-09-13 NOTE — Telephone Encounter (Signed)
Patient was returning call. Please advise ?

## 2022-09-13 NOTE — Telephone Encounter (Signed)
Patient informed and verbalized understanding of plan. Copy sent to PCP 

## 2022-09-15 ENCOUNTER — Ambulatory Visit (HOSPITAL_COMMUNITY)
Admission: RE | Admit: 2022-09-15 | Discharge: 2022-09-15 | Disposition: A | Discharge status: Home / Self Care | Payer: Medicare HMO | Source: Ambulatory Visit | Attending: Cardiology | Admitting: Cardiology

## 2022-09-15 DIAGNOSIS — I2510 Atherosclerotic heart disease of native coronary artery without angina pectoris: Secondary | ICD-10-CM | POA: Insufficient documentation

## 2022-09-15 DIAGNOSIS — J90 Pleural effusion, not elsewhere classified: Secondary | ICD-10-CM | POA: Insufficient documentation

## 2022-09-15 DIAGNOSIS — Z951 Presence of aortocoronary bypass graft: Secondary | ICD-10-CM | POA: Insufficient documentation

## 2022-09-19 ENCOUNTER — Ambulatory Visit: Payer: Medicare HMO | Attending: Nurse Practitioner | Admitting: Nurse Practitioner

## 2022-09-19 ENCOUNTER — Encounter: Payer: Self-pay | Admitting: Nurse Practitioner

## 2022-09-19 VITALS — BP 136/62 | HR 80 | Ht 65.0 in | Wt 171.6 lb

## 2022-09-19 DIAGNOSIS — I5020 Unspecified systolic (congestive) heart failure: Secondary | ICD-10-CM

## 2022-09-19 DIAGNOSIS — R7989 Other specified abnormal findings of blood chemistry: Secondary | ICD-10-CM | POA: Diagnosis not present

## 2022-09-19 DIAGNOSIS — I255 Ischemic cardiomyopathy: Secondary | ICD-10-CM | POA: Diagnosis not present

## 2022-09-19 DIAGNOSIS — Z951 Presence of aortocoronary bypass graft: Secondary | ICD-10-CM | POA: Diagnosis not present

## 2022-09-19 DIAGNOSIS — E785 Hyperlipidemia, unspecified: Secondary | ICD-10-CM

## 2022-09-19 DIAGNOSIS — I10 Essential (primary) hypertension: Secondary | ICD-10-CM | POA: Diagnosis not present

## 2022-09-19 DIAGNOSIS — Z79899 Other long term (current) drug therapy: Secondary | ICD-10-CM | POA: Diagnosis not present

## 2022-09-19 DIAGNOSIS — I509 Heart failure, unspecified: Secondary | ICD-10-CM

## 2022-09-19 DIAGNOSIS — I2510 Atherosclerotic heart disease of native coronary artery without angina pectoris: Secondary | ICD-10-CM | POA: Diagnosis not present

## 2022-09-19 MED ORDER — SPIRONOLACTONE 25 MG PO TABS: 25.0000 mg | ORAL_TABLET | Freq: Every day | ORAL | 3 refills | Status: DC

## 2022-09-19 MED ORDER — ATORVASTATIN CALCIUM 80 MG PO TABS: 80.0000 mg | ORAL_TABLET | Freq: Every day | ORAL | Status: DC

## 2022-09-19 MED ORDER — CLOPIDOGREL BISULFATE 75 MG PO TABS: 75.0000 mg | ORAL_TABLET | Freq: Every day | ORAL | 3 refills | Status: DC

## 2022-09-19 MED ORDER — METOPROLOL TARTRATE 25 MG PO TABS: 25.0000 mg | ORAL_TABLET | Freq: Two times a day (BID) | ORAL | 3 refills | Status: DC

## 2022-09-19 MED ORDER — NITROGLYCERIN 0.4 MG SL SUBL: 0.4000 mg | SUBLINGUAL_TABLET | SUBLINGUAL | 3 refills | Status: AC | PRN

## 2022-09-19 NOTE — Patient Instructions (Addendum)
Medication Instructions:   Please hold the Lipitor x 1 week, then call the office with update on how feeling.   Continue all other medications.     Labwork:  BMET, BNP, Mg - orders given  Please do in 1 week Office will contact with results via phone, letter or mychart.     Testing/Procedures:  none  Follow-Up:  1 month   Any Other Special Instructions Will Be Listed Below (If Applicable).  You have been referred to:  cardiac rehab    If you need a refill on your cardiac medications before your next appointment, please call your pharmacy.

## 2022-09-19 NOTE — Progress Notes (Unsigned)
Office Visit    Patient Name: Suzanne Tapia Date of Encounter: 09/19/2022  PCP:  Rebekah Chesterfield, NP   [facility]  Cardiologist:  Nona Dell, MD  Advanced Practice Provider:  No care team member to display Electrophysiologist:  None   Chief Complaint    Suzanne Tapia is a 67 y.o. female with a hx of palpitations, CAD, s/p CABG x 3 and stenting, ICM/HFrEF, hyperlipidemia, left bundle branch block, history of chest pain, type 1 diabetes, and hypertension, who presents today for follow-up.   Past Medical History    Past Medical History:  Diagnosis Date   CAD (coronary artery disease)    Mild to moderate by cardiac catheterization December 2015   Essential hypertension    Heart failure with mildly reduced ejection fraction (HFmrEF) (HCC)    ICM EF 40-45%   History of palpitations    Negative CardioNet monitor 2012   Mixed hyperlipidemia    S/P CABG (coronary artery bypass graft)    s/p CABG x 3 including reverse saphenous vein graft sequenced to distal OM and proximal obtuse marginal artery, LIMA-mid LAD on August 16, 2022   Type 2 diabetes mellitus [facility])    Past Surgical History:  Procedure Laterality Date   BILATERAL SALPINGOOPHORECTOMY     BREAST LUMPECTOMY Bilateral    CARPAL TUNNEL RELEASE Left    LEFT HEART CATHETERIZATION WITH CORONARY ANGIOGRAM N/A 04/28/2014   Procedure: LEFT HEART CATHETERIZATION WITH CORONARY ANGIOGRAM;  Surgeon: Micheline Chapman, MD;  Location: [facility] CATH LAB;  Service: Cardiovascular;  Laterality: N/A;    Allergies  Allergies  Allergen Reactions   Sulfa Antibiotics Other (See Comments)    REACTION: hyperactivity    History of Present Illness    Suzanne Tapia is a 67 y.o. female with a PMH as mentioned above.  Underwent diagnostic cardiac cath for accelerating angina in 2015 that revealed mild to moderate CAD, normal LV function.  Seen by Eligha Bridegroom, NP on 12/2021 for worsening angina.  [place] when she had the most severe episode of chest pain.  Took a nitroglycerin, symptoms resolved.  Symptoms returned after moving around, reported taking several nitroglycerin over the span of several hours.  Also noted shortness of breath with exertion.  At the office visit, was advised to increase Imdur to 60 mg daily.  She noted some episodes of acid reflux, was advised to try Gaviscon or Pepcid.  Last seen by Eligha Bridegroom, NP for follow-up on 01/2022.  She noted an episode of chest pain after eating.  Symptoms relieved with nitroglycerin.  Stated anxiety may exacerbate her symptoms.  Took several sublingual nitroglycerin when neighbor was found deceased, as she thought her emotional upset would make her chest pain worse.  Denied any associated symptoms.  HCTZ added to medication regimen to help improve BP.  Admitted to [facility] in [place], [place] 08/2022.  Records indicate she underwent cardiac catheterization that ultimately required CABG x 3, including reverse saphenous vein graft sequenced to distal OM and proximal obtuse marginal artery, LIMA-mid LAD on August 16, 2022.Marland Kitchen She also underwent left atrial appendage ligation with 40 mm atrial clip.  Intraoperative TEE revealed preserved left ventricular function, no new wall motion abnormalities were noted on intraoperative TEE.  TTE revealed EF 40 to 45%, mildly depressed left ventricular systolic function, dyssynchronous septal wall motion consistent with intraventricular conduction delay or bundle branch block, stage  II DD, no significant valvular abnormalities. Cardiac catheterization was performed on August 19, 2022.  She underwent successful complex PCI of RCA using IVUS guidance, shockwave IVL, and overlapping Synergy drug-eluting stents.  Recommended for DAPT x 12 months with aspirin and Plavix, aggressive medical therapy and risk factor modification recommended.  I last saw her for post CABG  follow-up with her husband on August 25, 2022. I reviewed her AVS that did show diagnosis of acute pulmonary edema that must have occurred during hospitalization, as well as NSTEMI and elevated troponins. She returned back to receive medical care close to home.  Husband had question regarding getting a chest x-ray done locally.  Patient was overall doing well since surgery.  Noted weakness and generalized musculoskeletal soreness, denied any chest pain.  Tolerating her medications well. Denied any chest pain, shortness of breath, palpitations, syncope, presyncope, dizziness, orthopnea, PND, swelling or significant weight changes, acute bleeding, or claudication.  2 view chest x-ray was done postop and revealed cardiomegaly without heart failure, moderate left pleural effusion and associated atelectasis.  Was reviewed by Dr. Diona Browner who recommended increasing Lasix temporarily. Echocardiogram arranged-see report below.  She underwent repeat two-view chest x-ray on Sep 15, 2022 that showed resolution of pleural effusion.   Today she presents for follow-up. She is doing well, but admits to not feeling well, wonders if it is one of her medications. Denies any chest pain, shortness of breath, palpitations, syncope, presyncope, dizziness, orthopnea, PND, swelling or significant weight changes, acute bleeding, or claudication. Compliant with her medications.   EKGs/Labs/Other Studies Reviewed:   The following studies were reviewed today:   EKG:  EKG is not ordered today.    Echo 08/2022:   1. Left ventricular ejection fraction, by estimation, is 40 to 45%. The  left ventricle has mildly decreased function. The left ventricle  demonstrates regional wall motion abnormalities (see scoring  diagram/findings for description). There is mild left  ventricular hypertrophy. Left ventricular diastolic parameters are  consistent with Grade I diastolic dysfunction (impaired relaxation). The  average left ventricular  global longitudinal strain is -11.0 %. The global  longitudinal strain is abnormal.   2. Right ventricular systolic function was not well visualized. The right  ventricular size is normal. Tricuspid regurgitation signal is inadequate  for assessing PA pressure.   3. The mitral valve was not well visualized. No evidence of mitral valve  regurgitation. No evidence of mitral stenosis.   4. The aortic valve was not well visualized. Aortic valve regurgitation  is trivial. No aortic stenosis is present.   5. The inferior vena cava is normal in size with greater than 50%  respiratory variability, suggesting right atrial pressure of 3 mmHg.   Comparison(s): No prior Echocardiogram.  Recent Labs: No results found for requested labs within last 365 days.  Recent Lipid Panel    Component Value Date/Time   CHOL 175 04/15/2020 1557   TRIG 342 (H) 04/15/2020 1557   HDL 33 (L) 04/15/2020 1557   CHOLHDL 5.3 (H) 04/15/2020 1557   LDLCALC 96 04/15/2020 1557     Home Medications   Current Meds  Medication Sig   aspirin EC 81 MG tablet Take 81 mg by mouth daily.   escitalopram (LEXAPRO) 10 MG tablet Take 5 mg by mouth daily.   famotidine (PEPCID) 20 MG tablet Take 20 mg by mouth daily.   fenofibrate 160 MG tablet Take 160 mg by mouth daily.   furosemide (LASIX) 40 MG tablet Take 0.5 tablets (20  mg total) by mouth daily.   Insulin Glargine Solostar (LANTUS) 100 UNIT/ML Solostar Pen Inject 110 Units into the skin daily.   loratadine (CLARITIN) 10 MG tablet Take 10 mg by mouth daily.   NOVOLOG 100 UNIT/ML injection Inject into the skin. 55 units in am and 60 in pm   potassium chloride (KLOR-CON) 10 MEQ tablet Take 1 tablet (10 mEq total) by mouth daily.   SALINE NASAL MIST NA Place 2 sprays into the nose as needed.    atorvastatin (LIPITOR) 80 MG tablet Take 80 mg by mouth daily.    clopidogrel (PLAVIX) 75 MG tablet Take 75 mg by mouth daily.    metoprolol tartrate (LOPRESSOR) 25 MG tablet Take  25 mg by mouth 2 (two) times daily.   nitroGLYCERIN (NITROSTAT) 0.4 MG SL tablet Place 1 tablet (0.4 mg total) under the tongue every 5 (five) minutes x 3 doses as needed for chest pain (If no relief after 2nd dose, proceed to the ED for an evaluation). U   spironolactone (ALDACTONE) 25 MG tablet Take 25 mg by mouth daily.     Review of Systems    All other systems reviewed and are otherwise negative except as noted above.  Physical Exam    VS:  BP 136/62   Pulse 80   Ht 5\' 5"  (1.651 m)   Wt 171 lb 9.6 oz (77.8 kg)   SpO2 96%   BMI 28.56 kg/m  , BMI Body mass index is 28.56 kg/m.  Wt Readings from Last 3 Encounters:  09/19/22 171 lb 9.6 oz (77.8 kg)  08/25/22 176 lb 12.8 oz (80.2 kg)  02/24/22 178 lb 3.2 oz (80.8 kg)     GEN: Well nourished, well developed, in no acute distress. HEENT: normal. Neck: Supple, no JVD, carotid bruits, or masses. Cardiac: S1/S2, RRR, no murmurs, rubs, or gallops. No clubbing, cyanosis, no edema.  Radials/PT 2+ and equal bilaterally.  Respiratory:  Respirations regular and unlabored, clear to auscultation bilaterally. MS: No deformity or atrophy. Skin: Warm and dry, no rash.  Sternal incision is healing well and approximated. Neuro:  Strength and sensation are intact. Psych: Normal affect.  Assessment & Plan    CAD, s/p CABG and stenting Stable with no anginal symptoms. Continue aspirin, Plavix, fenofibrate, Imdur, Lopressor, and nitroglycerin as needed. Will place referral for cardiac rehab. Will hold atorvastatin x 1 week to see how she does, see below. Providing refills per her request. Heart healthy diet encouraged.   2. HFrEF, ICM, medication management Echo 08/2022 revealed EF 40-45%, however Dr. Diona Browner who reviewed images states it appears around 35%. Euvolemic and well compensated on exam. Continue current GDMT. Will obtain labs in 1 week: BMET, proBNP, and Magnesium since taking Lasix. No further room to uptitrate at this time and pt  expresses hesitancy with further titration. Low sodium diet, fluid restriction <2L, and daily weights encouraged. Educated to contact our office for weight gain of 2 lbs overnight or 5 lbs in one week. Plan to arrange/schedule repeat Echo at next OV for mid July 2024.   3. Hyperlipidemia Admits to not feeling well. Will hold atorvastatin x 1 week, and she will let me know how she does. If no improvement, will restart. Heart healthy diet encouraged.   4. HTN Blood pressure stable.  No medication changes at this time. Obtaining labs as mentioned above. Discussed to monitor BP at home at least 2 hours after medications and sitting for 5-10 minutes. Heart healthy diet encouraged.  5. Elevated serum creatinine Most recent labs revealed sCr 1.40 with eGFR 42. Will repeat BMET in 1 week. Avoid nephrotoxic agents. Encouraged adequate hydration. Continue to follow with PCP.  Disposition: Follow up in 1 month(s) with Nona Dell, MD or APP.  Signed, Sharlene Dory, NP 09/21/2022, 9:39 PM [facility]

## 2022-09-23 ENCOUNTER — Telehealth: Payer: Self-pay | Admitting: *Deleted

## 2022-09-23 NOTE — Telephone Encounter (Signed)
-----   Message from Antoine Poche, MD sent at 09/21/2022  2:28 PM EDT ----- Covering for Dr Diona Browner, Previous fluid around the lung has resolved, normal chest xray  Dominga Ferry MD

## 2022-09-23 NOTE — Telephone Encounter (Signed)
  Lesle Chris, LPN [id number]/82/9562  1:30 AM EDT Back to Top    Notified husband Jonny Ruiz), copy to pcp.

## 2022-09-27 ENCOUNTER — Telehealth: Payer: Self-pay | Admitting: Cardiology

## 2022-09-27 DIAGNOSIS — Z79899 Other long term (current) drug therapy: Secondary | ICD-10-CM | POA: Diagnosis not present

## 2022-09-27 DIAGNOSIS — Z951 Presence of aortocoronary bypass graft: Secondary | ICD-10-CM | POA: Diagnosis not present

## 2022-09-27 DIAGNOSIS — R7989 Other specified abnormal findings of blood chemistry: Secondary | ICD-10-CM | POA: Diagnosis not present

## 2022-09-27 DIAGNOSIS — I10 Essential (primary) hypertension: Secondary | ICD-10-CM | POA: Diagnosis not present

## 2022-09-27 DIAGNOSIS — I509 Heart failure, unspecified: Secondary | ICD-10-CM | POA: Diagnosis not present

## 2022-09-27 MED ORDER — ATORVASTATIN CALCIUM 80 MG PO TABS: 80.0000 mg | ORAL_TABLET | Freq: Every day | ORAL | 1 refills | Status: DC

## 2022-09-27 NOTE — Telephone Encounter (Signed)
Pt c/o medication issue:  1. Name of Medication: atorvastatin (LIPITOR) 80 MG tablet   2. How are you currently taking this medication (dosage and times per day)? Stopped for a week  3. Are you having a reaction (difficulty breathing--STAT)?   4. What is your medication issue? Patient was advised to call back after stopping this medication for a week. Requesting call back to go over results of that pause.

## 2022-09-27 NOTE — Telephone Encounter (Signed)
Refill sent for atorvastatin. 

## 2022-09-27 NOTE — Telephone Encounter (Signed)
Took last atorvastatin dose last Monday 09/17/2022 and says she feels better. Says she actually started feeling better 2 days before holding atorvastatin. Patient would like to retry atorvastatin to see if symptoms return since she noticed feeling better prior to stopping atorvastatin. Will send to provider.

## 2022-09-29 ENCOUNTER — Other Ambulatory Visit (HOSPITAL_COMMUNITY): Payer: Self-pay

## 2022-09-29 DIAGNOSIS — Z951 Presence of aortocoronary bypass graft: Secondary | ICD-10-CM

## 2022-09-29 DIAGNOSIS — I214 Non-ST elevation (NSTEMI) myocardial infarction: Secondary | ICD-10-CM

## 2022-10-19 ENCOUNTER — Encounter (HOSPITAL_COMMUNITY): Payer: Medicare HMO

## 2022-10-20 ENCOUNTER — Ambulatory Visit: Payer: Medicare HMO | Attending: Nurse Practitioner | Admitting: Nurse Practitioner

## 2022-10-20 ENCOUNTER — Encounter: Payer: Self-pay | Admitting: Nurse Practitioner

## 2022-10-20 VITALS — BP 118/70 | HR 75 | Ht 65.0 in | Wt 170.8 lb

## 2022-10-20 DIAGNOSIS — Z79899 Other long term (current) drug therapy: Secondary | ICD-10-CM

## 2022-10-20 DIAGNOSIS — E785 Hyperlipidemia, unspecified: Secondary | ICD-10-CM | POA: Diagnosis not present

## 2022-10-20 DIAGNOSIS — I10 Essential (primary) hypertension: Secondary | ICD-10-CM | POA: Diagnosis not present

## 2022-10-20 DIAGNOSIS — I2510 Atherosclerotic heart disease of native coronary artery without angina pectoris: Secondary | ICD-10-CM

## 2022-10-20 DIAGNOSIS — I255 Ischemic cardiomyopathy: Secondary | ICD-10-CM

## 2022-10-20 DIAGNOSIS — I5020 Unspecified systolic (congestive) heart failure: Secondary | ICD-10-CM

## 2022-10-20 DIAGNOSIS — Z951 Presence of aortocoronary bypass graft: Secondary | ICD-10-CM | POA: Diagnosis not present

## 2022-10-20 DIAGNOSIS — R7989 Other specified abnormal findings of blood chemistry: Secondary | ICD-10-CM

## 2022-10-20 MED ORDER — FUROSEMIDE 40 MG PO TABS: ORAL_TABLET | ORAL | 4 refills | Status: AC

## 2022-10-20 MED ORDER — POTASSIUM CHLORIDE ER 10 MEQ PO TBCR: EXTENDED_RELEASE_TABLET | ORAL | 3 refills | Status: AC

## 2022-10-20 MED ORDER — METOPROLOL SUCCINATE ER 50 MG PO TB24: 25.0000 mg | ORAL_TABLET | Freq: Every day | ORAL | 3 refills | Status: DC

## 2022-10-20 NOTE — Patient Instructions (Addendum)
Medication Instructions:  Your physician has recommended you make the following change in your medication: Stop taking metoprolol tartrate  Start taking metoprolol succinate 25mg  daily Take lasix daily as needed Take potasium daily as needed  Continue all other medications as prescribed   Labwork: BMET at [facility] in 2-3 weeks  Testing/Procedures: none  Follow-Up: Your physician recommends that you schedule a follow-up appointment in: 4-6 Month follow up with Philis Nettle  Any Other Special Instructions Will Be Listed Below (If Applicable).  If you need a refill on your cardiac medications before your next appointment, please call your pharmacy.

## 2022-10-20 NOTE — Progress Notes (Signed)
Office Visit    Patient Name: Suzanne Tapia Date of Encounter: 10/20/2022  PCP:  Rebekah Chesterfield, NP   [facility]  Cardiologist:  Nona Dell, MD  Advanced Practice Provider:  No care team member to display Electrophysiologist:  None   Chief Complaint    Suzanne Tapia is a 67 y.o. female with a hx of palpitations, CAD, s/p CABG x 3 and stenting, ICM/HFrEF, hyperlipidemia, left bundle branch block, history of chest pain, type 1 diabetes, and hypertension, who presents today for follow-up.   Past Medical History    Past Medical History:  Diagnosis Date   CAD (coronary artery disease)    Mild to moderate by cardiac catheterization December 2015   Essential hypertension    Heart failure with mildly reduced ejection fraction (HFmrEF) (HCC)    ICM EF 40-45%   History of palpitations    Negative CardioNet monitor 2012   Mixed hyperlipidemia    S/P CABG (coronary artery bypass graft)    s/p CABG x 3 including reverse saphenous vein graft sequenced to distal OM and proximal obtuse marginal artery, LIMA-mid LAD on August 16, 2022   Type 2 diabetes mellitus [facility])    Past Surgical History:  Procedure Laterality Date   BILATERAL SALPINGOOPHORECTOMY     BREAST LUMPECTOMY Bilateral    CARPAL TUNNEL RELEASE Left    LEFT HEART CATHETERIZATION WITH CORONARY ANGIOGRAM N/A 04/28/2014   Procedure: LEFT HEART CATHETERIZATION WITH CORONARY ANGIOGRAM;  Surgeon: Micheline Chapman, MD;  Location: [facility] CATH LAB;  Service: Cardiovascular;  Laterality: N/A;    Allergies  Allergies  Allergen Reactions   Sulfa Antibiotics Other (See Comments)    REACTION: hyperactivity    History of Present Illness    Suzanne Tapia is a very pleasant 67 y.o. female with a PMH as mentioned above.  Underwent diagnostic cardiac cath for accelerating angina in 2015 that revealed mild to moderate CAD, normal LV function.  Seen by Eligha Bridegroom, NP on 12/2021 for worsening angina.   [place] the beach a [place] prior when she had the most severe episode of chest pain.  Took a nitroglycerin, symptoms resolved.  Symptoms returned after moving around, reported taking several nitroglycerin over the span of several hours.  Also noted shortness of breath with exertion.  At the office visit, was advised to increase Imdur to 60 mg daily.  She noted some episodes of acid reflux, was advised to try Gaviscon or Pepcid.  Last seen by Eligha Bridegroom, NP for follow-up on 01/2022.  She noted an episode of chest pain after eating.  Symptoms relieved with nitroglycerin.  Stated anxiety may exacerbate her symptoms.  Took several sublingual nitroglycerin when neighbor was found deceased, as she thought her emotional upset would make her chest pain worse.  Denied any associated symptoms.  HCTZ added to medication regimen to help improve BP.  Admitted to [facility] in [place], [place] 08/2022.  Records indicate she underwent cardiac catheterization that ultimately required CABG x 3, including reverse saphenous vein graft sequenced to distal OM and proximal obtuse marginal artery, LIMA-mid LAD on August 16, 2022.Marland Kitchen She also underwent left atrial appendage ligation with 40 mm atrial clip.  Intraoperative TEE revealed preserved left ventricular function, no new wall motion abnormalities were noted on intraoperative TEE.  TTE revealed EF 40 to 45%, mildly depressed left ventricular systolic function, dyssynchronous septal wall motion consistent with intraventricular conduction delay or bundle branch  block, stage II DD, no significant valvular abnormalities. Cardiac catheterization was performed on August 19, 2022.  She underwent successful complex PCI of RCA using IVUS guidance, shockwave IVL, and overlapping Synergy drug-eluting stents.  Recommended for DAPT x 12 months with aspirin and Plavix, aggressive medical therapy and risk factor modification recommended.  I saw her for post  CABG follow-up with her husband on August 25, 2022. I reviewed her AVS that did show diagnosis of acute pulmonary edema that must have occurred during hospitalization, as well as NSTEMI and elevated troponins. She returned back to receive medical care close to home.  Husband had question regarding getting a chest x-ray done locally.  Patient was overall doing well since surgery.  Noted weakness and generalized musculoskeletal soreness, denied any chest pain.  Tolerating her medications well.  2 view chest x-ray was done postop and revealed cardiomegaly without heart failure, moderate left pleural effusion and associated atelectasis.  Was reviewed by Dr. Diona Browner who recommended increasing Lasix temporarily. Echocardiogram arranged-see report below.  She underwent repeat two-view chest x-ray on Sep 15, 2022 that showed resolution of pleural effusion.   Today she presents for follow-up. She is doing well, and has been walking regularly. She says she goes to her cardiac rehab assessment on July 3rd. Denies any chest pain, shortness of breath, palpitations, syncope, presyncope, dizziness, orthopnea, PND, swelling or significant weight changes, acute bleeding, or claudication. Does admit to generalized chest soreness from surgery noticed with certain movements. Requesting to eliminate some medications/pill burden.    EKGs/Labs/Other Studies Reviewed:   The following studies were reviewed today:   EKG:  EKG is not ordered today.    Echo 08/2022:   1. Left ventricular ejection fraction, by estimation, is 40 to 45%. The  left ventricle has mildly decreased function. The left ventricle  demonstrates regional wall motion abnormalities (see scoring  diagram/findings for description). There is mild left  ventricular hypertrophy. Left ventricular diastolic parameters are  consistent with Grade I diastolic dysfunction (impaired relaxation). The  average left ventricular global longitudinal strain is -11.0 %. The  global  longitudinal strain is abnormal.   2. Right ventricular systolic function was not well visualized. The right  ventricular size is normal. Tricuspid regurgitation signal is inadequate  for assessing PA pressure.   3. The mitral valve was not well visualized. No evidence of mitral valve  regurgitation. No evidence of mitral stenosis.   4. The aortic valve was not well visualized. Aortic valve regurgitation  is trivial. No aortic stenosis is present.   5. The inferior vena cava is normal in size with greater than 50%  respiratory variability, suggesting right atrial pressure of 3 mmHg.   Comparison(s): No prior Echocardiogram.  Recent Labs: No results found for requested labs within last 365 days.  Recent Lipid Panel    Component Value Date/Time   CHOL 175 04/15/2020 1557   TRIG 342 (H) 04/15/2020 1557   HDL 33 (L) 04/15/2020 1557   CHOLHDL 5.3 (H) 04/15/2020 1557   LDLCALC 96 04/15/2020 1557     Home Medications   Current Meds  Medication Sig   aspirin EC 81 MG tablet Take 81 mg by mouth daily.   atorvastatin (LIPITOR) 80 MG tablet Take 1 tablet (80 mg total) by mouth daily.   clopidogrel (PLAVIX) 75 MG tablet Take 1 tablet (75 mg total) by mouth daily.   escitalopram (LEXAPRO) 10 MG tablet Take 5 mg by mouth daily.   famotidine (PEPCID) 20 MG tablet  Take 20 mg by mouth daily.   fenofibrate 160 MG tablet Take 160 mg by mouth daily.   Insulin Glargine Solostar (LANTUS) 100 UNIT/ML Solostar Pen Inject 110 Units into the skin daily.   loratadine (CLARITIN) 10 MG tablet Take 10 mg by mouth daily.   metoprolol succinate (TOPROL-XL) 50 MG 24 hr tablet Take 0.5 tablets (25 mg total) by mouth daily. Take with or immediately following a meal.   nitroGLYCERIN (NITROSTAT) 0.4 MG SL tablet Place 1 tablet (0.4 mg total) under the tongue every 5 (five) minutes x 3 doses as needed for chest pain (If no relief after 2nd dose, proceed to the ED for an evaluation). U   NOVOLOG 100  UNIT/ML injection Inject into the skin. 55 units in am and 60 in pm   SALINE NASAL MIST NA Place 2 sprays into the nose as needed.   spironolactone (ALDACTONE) 25 MG tablet Take 1 tablet (25 mg total) by mouth daily.    furosemide (LASIX) 40 MG tablet Take 0.5 tablets (20 mg total) by mouth daily.   metoprolol tartrate (LOPRESSOR) 25 MG tablet Take 1 tablet (25 mg total) by mouth 2 (two) times daily.    potassium chloride (KLOR-CON) 10 MEQ tablet Take 1 tablet (10 mEq total) by mouth daily.   Review of Systems    All other systems reviewed and are otherwise negative except as noted above.  Physical Exam    VS:  BP 118/70   Pulse 75   Ht 5\' 5"  (1.651 m)   Wt 170 lb 12.8 oz (77.5 kg)   SpO2 95%   BMI 28.42 kg/m  , BMI Body mass index is 28.42 kg/m.  Wt Readings from Last 3 Encounters:  10/20/22 170 lb 12.8 oz (77.5 kg)  09/19/22 171 lb 9.6 oz (77.8 kg)  08/25/22 176 lb 12.8 oz (80.2 kg)     GEN: Well nourished, well developed, in no acute distress. HEENT: normal. Neck: Supple, no JVD, carotid bruits, or masses. Cardiac: S1/S2, RRR, no murmurs, rubs, or gallops. No clubbing, cyanosis, no edema.  Radials/PT 2+ and equal bilaterally.  Respiratory:  Respirations regular and unlabored, clear to auscultation bilaterally. MS: No deformity or atrophy. Skin: Warm and dry, no rash.  Sternal incision is healing well and approximated. Neuro:  Strength and sensation are intact. Psych: Normal affect.  Assessment & Plan    CAD, s/p CABG and stenting Stable with no anginal symptoms. Continue aspirin, Plavix, fenofibrate, Imdur, and nitroglycerin as needed. Will stop Metoprolol tartrate and switch Metoprolol to 25 mg daily to relieve pill burden. Heart healthy diet encouraged. Will be starting cardiac rehab soon.   2. HFrEF, ICM Stage C, NYHA class I. Echo 08/2022 revealed EF 40-45%, however Dr. Diona Browner who reviewed images states it appears around 35%. Euvolemic and well compensated on exam.  Switching Metoprolol to Toprol XL 25 mg daily, not a candidate for SGLT2i d/t hx of UTI's, will switch Lasix and K+ to daily PRN. Continue current GDMT. Will obtain the following labs: BMET. Low sodium diet, fluid restriction <2L, and daily weights encouraged. Educated to contact our office for weight gain of 2 lbs overnight or 5 lbs in one week.   3. Hyperlipidemia  Doing well after resuming atorvastatin. Continue current medication regimen. Plan to recheck FLP and LFT at next OV.  4. HTN Blood pressure stable. Switching Metoprolol to Toprol XL 25 mg daily. Will obtain BMET as mentioned below. Discussed to monitor BP at home at least 2  hours after medications and sitting for 5-10 minutes. Heart healthy diet encouraged.   5. Elevated serum creatinine, medication management Most recent labs revealed sCr 1.23 with eGFR 49. Will repeat BMET. Avoid nephrotoxic agents. Encouraged adequate hydration. Continue to follow with PCP.   Disposition: Follow up in 4-6 months with Nona Dell, MD or APP.  Signed, Sharlene Dory, NP 10/21/2022, 10:07 PM [facility]

## 2022-10-31 ENCOUNTER — Telehealth (HOSPITAL_COMMUNITY): Payer: Self-pay | Admitting: *Deleted

## 2022-10-31 DIAGNOSIS — Z79899 Other long term (current) drug therapy: Secondary | ICD-10-CM | POA: Diagnosis not present

## 2022-10-31 NOTE — Telephone Encounter (Signed)
Cardiac Rehab Medication Review by a Nurse   Does the patient  feel that his/her medications are working for him/her?  yes   Has the patient been experiencing any side effects to the medications prescribed?  no   Does the patient measure his/her own blood pressure or blood glucose at home?  yes    Does the patient have any problems obtaining medications due to transportation or finances?   no   Understanding of regimen: excellent Understanding of indications: excellent Potential of compliance: excellent     Verified medication via phone with patient. Nurse comments:     

## 2022-11-02 ENCOUNTER — Encounter (HOSPITAL_COMMUNITY)
Admission: RE | Admit: 2022-11-02 | Discharge: 2022-11-02 | Disposition: A | Discharge status: Home / Self Care | Payer: Medicare HMO | Source: Ambulatory Visit | Attending: Cardiology | Admitting: Cardiology

## 2022-11-02 ENCOUNTER — Encounter (HOSPITAL_COMMUNITY): Payer: Medicare HMO

## 2022-11-02 VITALS — Ht 65.0 in | Wt 172.8 lb

## 2022-11-02 DIAGNOSIS — I214 Non-ST elevation (NSTEMI) myocardial infarction: Secondary | ICD-10-CM | POA: Diagnosis not present

## 2022-11-02 DIAGNOSIS — Z951 Presence of aortocoronary bypass graft: Secondary | ICD-10-CM | POA: Diagnosis not present

## 2022-11-02 NOTE — Patient Instructions (Signed)
Patient Instructions  Patient Details  Name: Suzanne Tapia MRN: [id number] Date of Birth: [birth year] Referring Provider:  Antoine Poche, MD  Below are your personal goals for exercise, nutrition, and risk factors. Our goal is to help you stay on track towards obtaining and maintaining these goals. We will be discussing your progress on these goals with you throughout the program.  Initial Exercise Prescription:  Initial Exercise Prescription - 11/02/22 1400       Date of Initial Exercise RX and Referring Provider   Date 11/02/22    Referring Provider Dina Rich MD   Attending Cargiologist: Dr. Nona Dell     Oxygen   Maintain Oxygen Saturation 88% or higher      Treadmill   MPH 2.1    Grade 0.5    Minutes 17    METs 2.75      Recumbant Bike   Level 2    RPM 50    Watts 19    Minutes 17    METs 2.7      NuStep   Level 2    SPM 80    Minutes 17    METs 2.7      REL-XR   Level 2    Watts 20    Speed 50    Minutes 17    METs 2.7      Prescription Details   Frequency (times per week) 2    Duration Progress to 30 minutes of continuous aerobic without signs/symptoms of physical distress      Intensity   THRR 40-80% of Max Heartrate 102-137    Ratings of Perceived Exertion 11-13    Perceived Dyspnea 0-4      Progression   Progression Continue to progress workloads to maintain intensity without signs/symptoms of physical distress.      Resistance Training   Training Prescription Yes    Weight 3 lb    Reps 10-15             Exercise Goals: Frequency: Be able to perform aerobic exercise two to three times per week in program working toward 2-5 days per week of home exercise.  Intensity: Work with a perceived exertion of 11 (fairly light) - 15 (hard) while following your exercise prescription.  We will make changes to your prescription with you as you progress through the program.   Duration: Be able to do 30 to 45 minutes of continuous  aerobic exercise in addition to a 5 minute warm-up and a 5 minute cool-down routine.   Nutrition Goals: Your personal nutrition goals will be established when you do your nutrition analysis with the dietician.  The following are general nutrition guidelines to follow: Cholesterol < 200mg /day Sodium < 1500mg /day Fiber: Women over 50 yrs - 21 grams per day  Personal Goals:  Personal Goals and Risk Factors at Admission - 11/02/22 1505       Core Components/Risk Factors/Patient Goals on Admission    Weight Management Yes;Obesity;Weight Loss    Intervention Weight Management: Develop a combined nutrition and exercise program designed to reach desired caloric intake, while maintaining appropriate intake of nutrient and fiber, sodium and fats, and appropriate energy expenditure required for the weight goal.;Weight Management/Obesity: Establish reasonable short term and long term weight goals.;Weight Management: Provide education and appropriate resources to help participant work on and attain dietary goals.;Obesity: Provide education and appropriate resources to help participant work on and attain dietary goals.    Admit Weight 172 lb 12.8  oz (78.4 kg)    Goal Weight: Short Term 167 lb (75.8 kg)    Goal Weight: Long Term 160 lb (72.6 kg)    Expected Outcomes Long Term: Adherence to nutrition and physical activity/exercise program aimed toward attainment of established weight goal;Short Term: Continue to assess and modify interventions until short term weight is achieved;Weight Loss: Understanding of general recommendations for a balanced deficit meal plan, which promotes 1-2 lb weight loss per week and includes a negative energy balance of [id number] kcal/d;Understanding recommendations for meals to include 15-35% energy as protein, 25-35% energy from fat, 35-60% energy from carbohydrates, less than 200mg  of dietary cholesterol, 20-35 gm of total fiber daily;Understanding of distribution of calorie intake  throughout the day with the consumption of 4-5 meals/snacks    Diabetes Yes    Intervention Provide education about signs/symptoms and action to take for hypo/hyperglycemia.;Provide education about proper nutrition, including hydration, and aerobic/resistive exercise prescription along with prescribed medications to achieve blood glucose in normal ranges: Fasting glucose 65-99 mg/dL    Expected Outcomes Short Term: Participant verbalizes understanding of the signs/symptoms and immediate care of hyper/hypoglycemia, proper foot care and importance of medication, aerobic/resistive exercise and nutrition plan for blood glucose control.;Long Term: Attainment of HbA1C < 7%.    Heart Failure Yes    Intervention Provide a combined exercise and nutrition program that is supplemented with education, support and counseling about heart failure. Directed toward relieving symptoms such as shortness of breath, decreased exercise tolerance, and extremity edema.    Expected Outcomes Improve functional capacity of life;Short term: Attendance in program 2-3 days a week with increased exercise capacity. Reported lower sodium intake. Reported increased fruit and vegetable intake. Reports medication compliance.;Short term: Daily weights obtained and reported for increase. Utilizing diuretic protocols set by physician.;Long term: Adoption of self-care skills and reduction of barriers for early signs and symptoms recognition and intervention leading to self-care maintenance.    Hypertension Yes    Intervention Provide education on lifestyle modifcations including regular physical activity/exercise, weight management, moderate sodium restriction and increased consumption of fresh fruit, vegetables, and low fat dairy, alcohol moderation, and smoking cessation.;Monitor prescription use compliance.    Expected Outcomes Short Term: Continued assessment and intervention until BP is < 140/42mm HG in hypertensive participants. < 130/87mm  HG in hypertensive participants with diabetes, heart failure or chronic kidney disease.;Long Term: Maintenance of blood pressure at goal levels.    Lipids Yes    Intervention Provide education and support for participant on nutrition & aerobic/resistive exercise along with prescribed medications to achieve LDL 70mg , HDL >40mg .    Expected Outcomes Short Term: Participant states understanding of desired cholesterol values and is compliant with medications prescribed. Participant is following exercise prescription and nutrition guidelines.;Long Term: Cholesterol controlled with medications as prescribed, with individualized exercise RX and with personalized nutrition plan. Value goals: LDL < 70mg , HDL > 40 mg.             Tobacco Use Initial Evaluation: Social History   Tobacco Use  Smoking Status Former   Packs/day: 0.30   Years: 10.00   Additional pack years: 0.00   Total pack years: 3.00   Types: Cigarettes   Start date: 08/10/1980   Quit date: 05/02/2000   Years since quitting: 22.5  Smokeless Tobacco Never    Exercise Goals and Review:  Exercise Goals     Row Name 11/02/22 1449             Exercise Goals  Increase Physical Activity Yes       Intervention Provide advice, education, support and counseling about physical activity/exercise needs.;Develop an individualized exercise prescription for aerobic and resistive training based on initial evaluation findings, risk stratification, comorbidities and participant's personal goals.       Expected Outcomes Short Term: Attend rehab on a regular basis to increase amount of physical activity.;Long Term: Add in home exercise to make exercise part of routine and to increase amount of physical activity.;Long Term: Exercising regularly at least 3-5 days a week.       Increase Strength and Stamina Yes       Intervention Provide advice, education, support and counseling about physical activity/exercise needs.;Develop an individualized  exercise prescription for aerobic and resistive training based on initial evaluation findings, risk stratification, comorbidities and participant's personal goals.       Expected Outcomes Short Term: Increase workloads from initial exercise prescription for resistance, speed, and METs.;Short Term: Perform resistance training exercises routinely during rehab and add in resistance training at home;Long Term: Improve cardiorespiratory fitness, muscular endurance and strength as measured by increased METs and functional capacity ( )       Able to understand and use rate of perceived exertion (RPE) scale Yes       Intervention Provide education and explanation on how to use RPE scale       Expected Outcomes Short Term: Able to use RPE daily in rehab to express subjective intensity level;Long Term:  Able to use RPE to guide intensity level when exercising independently       Able to understand and use Dyspnea scale Yes       Intervention Provide education and explanation on how to use Dyspnea scale       Expected Outcomes Short Term: Able to use Dyspnea scale daily in rehab to express subjective sense of shortness of breath during exertion;Long Term: Able to use Dyspnea scale to guide intensity level when exercising independently       Knowledge and understanding of Target Heart Rate Range (THRR) Yes       Intervention Provide education and explanation of THRR including how the numbers were predicted and where they are located for reference       Expected Outcomes Short Term: Able to state/look up THRR;Long Term: Able to use THRR to govern intensity when exercising independently;Short Term: Able to use daily as guideline for intensity in rehab       Able to check pulse independently Yes       Intervention Provide education and demonstration on how to check pulse in carotid and radial arteries.;Review the importance of being able to check your own pulse for safety during independent exercise       Expected  Outcomes Short Term: Able to explain why pulse checking is important during independent exercise;Long Term: Able to check pulse independently and accurately       Understanding of Exercise Prescription Yes       Intervention Provide education, explanation, and written materials on patient's individual exercise prescription       Expected Outcomes Short Term: Able to explain program exercise prescription;Long Term: Able to explain home exercise prescription to exercise independently              Copy of goals given to participant.

## 2022-11-02 NOTE — Progress Notes (Signed)
Cardiac Individual Treatment Plan  Patient Details  Name: Suzanne Tapia MRN: [id number] Date of Birth: [birth year] Referring Provider:   Flowsheet Row CARDIAC REHAB PHASE II ORIENTATION from 11/02/2022 in [facility] CARDIAC REHABILITATION  Referring Provider Dina Rich MD  [Attending Cargiologist: Dr. Remi Deter McDowell]       Initial Encounter Date:  Flowsheet Row CARDIAC REHAB PHASE II ORIENTATION from 11/02/2022 in [place] CARDIAC REHABILITATION  Date 11/02/22       Visit Diagnosis: S/P CABG x 3  NSTEMI (non-ST elevated myocardial infarction) [facility])  Patient's Home Medications on Admission:  Current Outpatient Medications:    Ascorbic Acid (VITAMIN C PO), Take 1 tablet by mouth daily. (Patient not taking: Reported on 10/31/2022), Disp: , Rfl:    aspirin EC 81 MG tablet, Take 81 mg by mouth daily., Disp: , Rfl:    atorvastatin (LIPITOR) 80 MG tablet, Take 1 tablet (80 mg total) by mouth daily., Disp: 90 tablet, Rfl: 1   clopidogrel (PLAVIX) 75 MG tablet, Take 1 tablet (75 mg total) by mouth daily., Disp: 90 tablet, Rfl: 3   escitalopram (LEXAPRO) 10 MG tablet, Take 5 mg by mouth daily., Disp: , Rfl:    famotidine (PEPCID) 20 MG tablet, Take 20 mg by mouth daily., Disp: , Rfl:    fenofibrate 160 MG tablet, Take 160 mg by mouth daily., Disp: , Rfl:    furosemide (LASIX) 40 MG tablet, Daily as needed, Disp: 45 tablet, Rfl: 4   Insulin Glargine Solostar (LANTUS) 100 UNIT/ML Solostar Pen, Inject 110 Units into the skin daily., Disp: , Rfl:    loratadine (CLARITIN) 10 MG tablet, Take 10 mg by mouth daily., Disp: , Rfl:    metoprolol succinate (TOPROL-XL) 50 MG 24 hr tablet, Take 0.5 tablets (25 mg total) by mouth daily. Take with or immediately following a meal., Disp: 90 tablet, Rfl: 3   nitroGLYCERIN (NITROSTAT) 0.4 MG SL tablet, Place 1 tablet (0.4 mg total) under the tongue every 5 (five) minutes x 3 doses as needed for chest pain (If no relief after 2nd dose, proceed to the ED for  an evaluation). U, Disp: 25 tablet, Rfl: 3   NOVOLOG 100 UNIT/ML injection, Inject into the skin. 55 units in am and 60 in pm, Disp: , Rfl:    potassium chloride (KLOR-CON) 10 MEQ tablet, Daily as eeded, Disp: 90 tablet, Rfl: 3   SALINE NASAL MIST NA, Place 2 sprays into the nose as needed., Disp: , Rfl:    spironolactone (ALDACTONE) 25 MG tablet, Take 1 tablet (25 mg total) by mouth daily., Disp: 90 tablet, Rfl: 3  Past Medical History: Past Medical History:  Diagnosis Date   CAD (coronary artery disease)    Mild to moderate by cardiac catheterization December 2015   Essential hypertension    Heart failure with mildly reduced ejection fraction (HFmrEF) (HCC)    ICM EF 40-45%   History of palpitations    Negative CardioNet monitor 2012   Mixed hyperlipidemia    S/P CABG (coronary artery bypass graft)    s/p CABG x 3 including reverse saphenous vein graft sequenced to distal OM and proximal obtuse marginal artery, LIMA-mid LAD on August 16, 2022   Type 2 diabetes mellitus (HCC)     Tobacco Use: Social History   Tobacco Use  Smoking Status Former   Packs/day: 0.30   Years: 10.00   Additional pack years: 0.00   Total pack years: 3.00   Types: Cigarettes   Start date: 08/10/1980  Quit date: 05/02/2000   Years since quitting: 22.5  Smokeless Tobacco Never    Labs: Review Flowsheet       Latest Ref Rng & Units 04/28/2014 04/15/2020  Labs for ITP Cardiac and Pulmonary Rehab  Cholestrol <200 mg/dL - 409   LDL (calc) mg/dL (calc) - 96   HDL-C > OR = 50 mg/dL - 33   Trlycerides <811 mg/dL - 914   TCO2 0 - 782 mmol/L 21  -    Capillary Blood Glucose: Lab Results  Component Value Date   GLUCAP 145 (H) 04/28/2014     Exercise Target Goals: Exercise Program Goal: Individual exercise prescription set using results from initial 6 min walk test and THRR while considering  patient's activity barriers and safety.   Exercise Prescription Goal: Starting with aerobic activity 30  plus minutes a day, 3 days per week for initial exercise prescription. Provide home exercise prescription and guidelines that participant acknowledges understanding prior to discharge.  Activity Barriers & Risk Stratification:  Activity Barriers & Cardiac Risk Stratification - 11/02/22 1247       Activity Barriers & Cardiac Risk Stratification   Activity Barriers Deconditioning;Muscular Weakness;Other (comment)    Comments bilateral carpel tunnel surgery, R weaker than L    Cardiac Risk Stratification High             6 Minute Walk:  6 Minute Walk     Row Name 11/02/22 1439         6 Minute Walk   Phase Initial     Distance 1212 feet     Walk Time 6 minutes     # of Rest Breaks 0     MPH 2.29     METS 2.77     RPE 11     VO2 Peak 9.69     Symptoms Yes (comment)     Comments leg incisional prickles     Resting HR 67 bpm     Resting BP 124/58     Resting Oxygen Saturation  97 %     Exercise Oxygen Saturation  during 6 min walk 96 %     Max Ex. HR 83 bpm     Max Ex. BP 134/72     2 Minute Post BP 122/64              Oxygen Initial Assessment:   Oxygen Re-Evaluation:   Oxygen Discharge (Final Oxygen Re-Evaluation):   Initial Exercise Prescription:  Initial Exercise Prescription - 11/02/22 1400       Date of Initial Exercise RX and Referring Provider   Date 11/02/22    Referring Provider Dina Rich MD   Attending Cargiologist: Dr. Nona Dell     Oxygen   Maintain Oxygen Saturation 88% or higher      Treadmill   MPH 2.1    Grade 0.5    Minutes 17    METs 2.75      Recumbant Bike   Level 2    RPM 50    Watts 19    Minutes 17    METs 2.7      NuStep   Level 2    SPM 80    Minutes 17    METs 2.7      REL-XR   Level 2    Watts 20    Speed 50    Minutes 17    METs 2.7      Prescription Details   Frequency (times per  week) 2    Duration Progress to 30 minutes of continuous aerobic without signs/symptoms of physical  distress      Intensity   THRR 40-80% of Max Heartrate 102-137    Ratings of Perceived Exertion 11-13    Perceived Dyspnea 0-4      Progression   Progression Continue to progress workloads to maintain intensity without signs/symptoms of physical distress.      Resistance Training   Training Prescription Yes    Weight 3 lb    Reps 10-15             Perform Capillary Blood Glucose checks as needed.  Exercise Prescription Changes:   Exercise Prescription Changes     Row Name 11/02/22 1400             Response to Exercise   Blood Pressure (Admit) 124/58       Blood Pressure (Exercise) 134/72       Blood Pressure (Exit) 122/64       Heart Rate (Admit) 67 bpm       Heart Rate (Exercise) 83 bpm       Heart Rate (Exit) 73 bpm       Oxygen Saturation (Admit) 97 %       Oxygen Saturation (Exercise) 96 %       Rating of Perceived Exertion (Exercise) 11       Symptoms leg incision tingling       Comments walk test results                Exercise Comments:   Exercise Goals and Review:   Exercise Goals     Row Name 11/02/22 1449             Exercise Goals   Increase Physical Activity Yes       Intervention Provide advice, education, support and counseling about physical activity/exercise needs.;Develop an individualized exercise prescription for aerobic and resistive training based on initial evaluation findings, risk stratification, comorbidities and participant's personal goals.       Expected Outcomes Short Term: Attend rehab on a regular basis to increase amount of physical activity.;Long Term: Add in home exercise to make exercise part of routine and to increase amount of physical activity.;Long Term: Exercising regularly at least 3-5 days a week.       Increase Strength and Stamina Yes       Intervention Provide advice, education, support and counseling about physical activity/exercise needs.;Develop an individualized exercise prescription for aerobic and  resistive training based on initial evaluation findings, risk stratification, comorbidities and participant's personal goals.       Expected Outcomes Short Term: Increase workloads from initial exercise prescription for resistance, speed, and METs.;Short Term: Perform resistance training exercises routinely during rehab and add in resistance training at home;Long Term: Improve cardiorespiratory fitness, muscular endurance and strength as measured by increased METs and functional capacity ( )       Able to understand and use rate of perceived exertion (RPE) scale Yes       Intervention Provide education and explanation on how to use RPE scale       Expected Outcomes Short Term: Able to use RPE daily in rehab to express subjective intensity level;Long Term:  Able to use RPE to guide intensity level when exercising independently       Able to understand and use Dyspnea scale Yes       Intervention Provide education and explanation on how to use Dyspnea  scale       Expected Outcomes Short Term: Able to use Dyspnea scale daily in rehab to express subjective sense of shortness of breath during exertion;Long Term: Able to use Dyspnea scale to guide intensity level when exercising independently       Knowledge and understanding of Target Heart Rate Range (THRR) Yes       Intervention Provide education and explanation of THRR including how the numbers were predicted and where they are located for reference       Expected Outcomes Short Term: Able to state/look up THRR;Long Term: Able to use THRR to govern intensity when exercising independently;Short Term: Able to use daily as guideline for intensity in rehab       Able to check pulse independently Yes       Intervention Provide education and demonstration on how to check pulse in carotid and radial arteries.;Review the importance of being able to check your own pulse for safety during independent exercise       Expected Outcomes Short Term: Able to explain  why pulse checking is important during independent exercise;Long Term: Able to check pulse independently and accurately       Understanding of Exercise Prescription Yes       Intervention Provide education, explanation, and written materials on patient's individual exercise prescription       Expected Outcomes Short Term: Able to explain program exercise prescription;Long Term: Able to explain home exercise prescription to exercise independently                Exercise Goals Re-Evaluation :  Exercise Goals Re-Evaluation     Row Name 11/02/22 1450             Exercise Goal Re-Evaluation   Exercise Goals Review Able to understand and use rate of perceived exertion (RPE) scale;Able to understand and use Dyspnea scale;Understanding of Exercise Prescription       Comments Reviewed RPE  and dyspnea scale, and program prescription with pt today.  Pt voiced understanding and was given a copy of goals to take home.       Expected Outcomes Short: Use RPE daily to regulate intensity.  Long: Follow program prescription                 Discharge Exercise Prescription (Final Exercise Prescription Changes):  Exercise Prescription Changes - 11/02/22 1400       Response to Exercise   Blood Pressure (Admit) 124/58    Blood Pressure (Exercise) 134/72    Blood Pressure (Exit) 122/64    Heart Rate (Admit) 67 bpm    Heart Rate (Exercise) 83 bpm    Heart Rate (Exit) 73 bpm    Oxygen Saturation (Admit) 97 %    Oxygen Saturation (Exercise) 96 %    Rating of Perceived Exertion (Exercise) 11    Symptoms leg incision tingling    Comments walk test results             Nutrition:  Target Goals: Understanding of nutrition guidelines, daily intake of sodium 1500mg , cholesterol 200mg , calories 30% from fat and 7% or less from saturated fats, daily to have 5 or more servings of fruits and vegetables.  Biometrics:  Pre Biometrics - 11/02/22 1451       Pre Biometrics   Height 5\' 5"   (1.651 m)    Weight 78.4 kg    Waist Circumference 36.5 inches    Hip Circumference 43 inches    Waist to Hip Ratio  0.85 %    BMI (Calculated) 28.76    Triceps Skinfold 22 mm    % Body Fat 38.4 %    Grip Strength 23.9 kg              Nutrition Therapy Plan and Nutrition Goals:  Nutrition Therapy & Goals - 11/02/22 1504       Intervention Plan   Intervention Nutrition handout(s) given to patient.;Prescribe, educate and counsel regarding individualized specific dietary modifications aiming towards targeted core components such as weight, hypertension, lipid management, diabetes, heart failure and other comorbidities.    Expected Outcomes Short Term Goal: Understand basic principles of dietary content, such as calories, fat, sodium, cholesterol and nutrients.             Nutrition Assessments:  Nutrition Assessments - 11/02/22 1505       MEDFICTS Scores   Pre Score 38            MEDIFICTS Score Key: ?70 Need to make dietary changes  40-70 Heart Healthy Diet ? 40 Therapeutic Level Cholesterol Diet   Picture Your Plate Scores: <81 Unhealthy dietary pattern with much room for improvement. 41-50 Dietary pattern unlikely to meet recommendations for good health and room for improvement. 51-60 More healthful dietary pattern, with some room for improvement.  >60 Healthy dietary pattern, although there may be some specific behaviors that could be improved.    Nutrition Goals Re-Evaluation:   Nutrition Goals Discharge (Final Nutrition Goals Re-Evaluation):   Psychosocial: Target Goals: Acknowledge presence or absence of significant depression and/or stress, maximize coping skills, provide positive support system. Participant is able to verbalize types and ability to use techniques and skills needed for reducing stress and depression.  Initial Review & Psychosocial Screening:  Initial Psych Review & Screening - 11/02/22 1244       Initial Review   Current  issues with Current Psychotropic Meds;Current Anxiety/Panic;Current Stress Concerns    Source of Stress Concerns Financial    Comments restircted income with retirement      Family Dynamics   Good Support System? Yes   husband and nieces     Barriers   Psychosocial barriers to participate in program There are no identifiable barriers or psychosocial needs.;The patient should benefit from training in stress management and relaxation.      Screening Interventions   Interventions Encouraged to exercise;To provide support and resources with identified psychosocial needs;Provide feedback about the scores to participant    Expected Outcomes Short Term goal: Utilizing psychosocial counselor, staff and physician to assist with identification of specific Stressors or current issues interfering with healing process. Setting desired goal for each stressor or current issue identified.;Long Term Goal: Stressors or current issues are controlled or eliminated.;Short Term goal: Identification and review with participant of any Quality of Life or Depression concerns found by scoring the questionnaire.;Long Term goal: The participant improves quality of Life and PHQ9 Scores as seen by post scores and/or verbalization of changes             Quality of Life Scores:  Quality of Life - 11/02/22 1504       Quality of Life   Select Quality of Life      Quality of Life Scores   Health/Function Pre 24.4 %    Socioeconomic Pre 26.21 %    Psych/Spiritual Pre 25.71 %    Family Pre 27 %    GLOBAL Pre 25.38 %  Scores of 19 and below usually indicate a poorer quality of life in these areas.  A difference of  2-3 points is a clinically meaningful difference.  A difference of 2-3 points in the total score of the Quality of Life Index has been associated with significant improvement in overall quality of life, self-image, physical symptoms, and general health in studies assessing change in quality of  life.  PHQ-9: Review Flowsheet       11/02/2022  Depression screen PHQ 2/9  Decreased Interest 0  Down, Depressed, Hopeless 0  PHQ - 2 Score 0  Altered sleeping 0  Tired, decreased energy 0  Change in appetite 0  Feeling bad or failure about yourself  0  Trouble concentrating 0  Moving slowly or fidgety/restless 0  Suicidal thoughts 0  PHQ-9 Score 0  Difficult doing work/chores Not difficult at all   Interpretation of Total Score  Total Score Depression Severity:  1-4 = Minimal depression, 5-9 = Mild depression, 10-14 = Moderate depression, 15-19 = Moderately severe depression, 20-27 = Severe depression   Psychosocial Evaluation and Intervention:  Psychosocial Evaluation - 11/02/22 1456       Psychosocial Evaluation & Interventions   Interventions Encouraged to exercise with the program and follow exercise prescription    Comments Aubrielle is coming into Cardiac Rehab after having a heart attack on vacation in [place].  She also underwent CABG x3 and DES to RCA.  She is apprehensive about going back to beach and would like to build her confidence back up to be able to go again.  She is already walking at home for 20 min each day and has a great support system.  Her husband and nieces that live near by are constantly checking up on her to make sure she is good. She has a history of anxiety with all of this but feels that her meds are working and helping her stay calm.  She is currently taking lexapro and feels well managed.  She is a good sleeper.  Her biggest barrier to rehab is her $35 copay and 30 min drive each way.  To help with this, we are only going to do 2 days a week versus 3 and only 24 session at first.  We will talk about other 12 after she gets into the program more.  She is also wants to work on some weight loss and learn her limits.    Expected Outcomes Short: Attend rehab to build confidence back up Long: continue to exercise to build stamina and confidence again     Continue Psychosocial Services  Follow up required by staff             Psychosocial Re-Evaluation:   Psychosocial Discharge (Final Psychosocial Re-Evaluation):   Vocational Rehabilitation: Provide vocational rehab assistance to qualifying candidates.   Vocational Rehab Evaluation & Intervention:  Vocational Rehab - 11/02/22 1243       Initial Vocational Rehab Evaluation & Intervention   Assessment shows need for Vocational Rehabilitation No   retired            Education: Education Goals: Education classes will be provided on a weekly basis, covering required topics. Participant will state understanding/return demonstration of topics presented.  Learning Barriers/Preferences:  Learning Barriers/Preferences - 11/02/22 1242       Learning Barriers/Preferences   Learning Barriers Sight   readers   Learning Preferences None             Education Topics: Hypertension,  Hypertension Reduction -Define heart disease and high blood pressure. Discus how high blood pressure affects the body and ways to reduce high blood pressure.   Exercise and Your Heart -Discuss why it is important to exercise, the FITT principles of exercise, normal and abnormal responses to exercise, and how to exercise safely.   Angina -Discuss definition of angina, causes of angina, treatment of angina, and how to decrease risk of having angina.   Cardiac Medications -Review what the following cardiac medications are used for, how they affect the body, and side effects that may occur when taking the medications.  Medications include Aspirin, Beta blockers, calcium channel blockers, ACE Inhibitors, angiotensin receptor blockers, diuretics, digoxin, and antihyperlipidemics.   Congestive Heart Failure -Discuss the definition of CHF, how to live with CHF, the signs and symptoms of CHF, and how keep track of weight and sodium intake.   Heart Disease and Intimacy -Discus the effect sexual  activity has on the heart, how changes occur during intimacy as we age, and safety during sexual activity.   Smoking Cessation / COPD -Discuss different methods to quit smoking, the health benefits of quitting smoking, and the definition of COPD.   Nutrition I: Fats -Discuss the types of cholesterol, what cholesterol does to the heart, and how cholesterol levels can be controlled.   Nutrition II: Labels -Discuss the different components of food labels and how to read food label   Heart Parts/Heart Disease and PAD -Discuss the anatomy of the heart, the pathway of blood circulation through the heart, and these are affected by heart disease.   Stress I: Signs and Symptoms -Discuss the causes of stress, how stress may lead to anxiety and depression, and ways to limit stress.   Stress II: Relaxation -Discuss different types of relaxation techniques to limit stress.   Warning Signs of Stroke / TIA -Discuss definition of a stroke, what the signs and symptoms are of a stroke, and how to identify when someone is having stroke.   Knowledge Questionnaire Score:  Knowledge Questionnaire Score - 11/02/22 1505       Knowledge Questionnaire Score   Pre Score 20/24             Core Components/Risk Factors/Patient Goals at Admission:  Personal Goals and Risk Factors at Admission - 11/02/22 1505       Core Components/Risk Factors/Patient Goals on Admission    Weight Management Yes;Obesity;Weight Loss    Intervention Weight Management: Develop a combined nutrition and exercise program designed to reach desired caloric intake, while maintaining appropriate intake of nutrient and fiber, sodium and fats, and appropriate energy expenditure required for the weight goal.;Weight Management/Obesity: Establish reasonable short term and long term weight goals.;Weight Management: Provide education and appropriate resources to help participant work on and attain dietary goals.;Obesity: Provide  education and appropriate resources to help participant work on and attain dietary goals.    Admit Weight 172 lb 12.8 oz (78.4 kg)    Goal Weight: Short Term 167 lb (75.8 kg)    Goal Weight: Long Term 160 lb (72.6 kg)    Expected Outcomes Long Term: Adherence to nutrition and physical activity/exercise program aimed toward attainment of established weight goal;Short Term: Continue to assess and modify interventions until short term weight is achieved;Weight Loss: Understanding of general recommendations for a balanced deficit meal plan, which promotes 1-2 lb weight loss per week and includes a negative energy balance of [phone] kcal/d;Understanding recommendations for meals to include 15-35% energy as protein, 25-35% energy  from fat, 35-60% energy from carbohydrates, less than 200mg  of dietary cholesterol, 20-35 gm of total fiber daily;Understanding of distribution of calorie intake throughout the day with the consumption of 4-5 meals/snacks    Diabetes Yes    Intervention Provide education about signs/symptoms and action to take for hypo/hyperglycemia.;Provide education about proper nutrition, including hydration, and aerobic/resistive exercise prescription along with prescribed medications to achieve blood glucose in normal ranges: Fasting glucose 65-99 mg/dL    Expected Outcomes Short Term: Participant verbalizes understanding of the signs/symptoms and immediate care of hyper/hypoglycemia, proper foot care and importance of medication, aerobic/resistive exercise and nutrition plan for blood glucose control.;Long Term: Attainment of HbA1C < 7%.    Heart Failure Yes    Intervention Provide a combined exercise and nutrition program that is supplemented with education, support and counseling about heart failure. Directed toward relieving symptoms such as shortness of breath, decreased exercise tolerance, and extremity edema.    Expected Outcomes Improve functional capacity of life;Short term: Attendance in  program 2-3 days a week with increased exercise capacity. Reported lower sodium intake. Reported increased fruit and vegetable intake. Reports medication compliance.;Short term: Daily weights obtained and reported for increase. Utilizing diuretic protocols set by physician.;Long term: Adoption of self-care skills and reduction of barriers for early signs and symptoms recognition and intervention leading to self-care maintenance.    Hypertension Yes    Intervention Provide education on lifestyle modifcations including regular physical activity/exercise, weight management, moderate sodium restriction and increased consumption of fresh fruit, vegetables, and low fat dairy, alcohol moderation, and smoking cessation.;Monitor prescription use compliance.    Expected Outcomes Short Term: Continued assessment and intervention until BP is < 140/36mm HG in hypertensive participants. < 130/54mm HG in hypertensive participants with diabetes, heart failure or chronic kidney disease.;Long Term: Maintenance of blood pressure at goal levels.    Lipids Yes    Intervention Provide education and support for participant on nutrition & aerobic/resistive exercise along with prescribed medications to achieve LDL 70mg , HDL >40mg .    Expected Outcomes Short Term: Participant states understanding of desired cholesterol values and is compliant with medications prescribed. Participant is following exercise prescription and nutrition guidelines.;Long Term: Cholesterol controlled with medications as prescribed, with individualized exercise RX and with personalized nutrition plan. Value goals: LDL < 70mg , HDL > 40 mg.             Core Components/Risk Factors/Patient Goals Review:    Core Components/Risk Factors/Patient Goals at Discharge (Final Review):    ITP Comments:  ITP Comments     Row Name 11/02/22 1425           ITP Comments Patient attend orientation today.  Patient is attending Cardiac Rehabilitation  Program.  Documentation for diagnosis can be found in [facility] office visit 10/20/22.  Reviewed medical chart, RPE, gym safety, and program guidelines.  Patient was fitted to equipment they will be using during rehab.  Patient is scheduled to start exercise on Monday November 07, 2022.                Comments: Initial ITP

## 2022-11-07 ENCOUNTER — Encounter (HOSPITAL_COMMUNITY)
Admission: RE | Admit: 2022-11-07 | Discharge: 2022-11-07 | Disposition: A | Discharge status: Home / Self Care | Payer: Medicare HMO | Source: Ambulatory Visit | Attending: Cardiology | Admitting: Cardiology

## 2022-11-07 DIAGNOSIS — I214 Non-ST elevation (NSTEMI) myocardial infarction: Secondary | ICD-10-CM | POA: Diagnosis not present

## 2022-11-07 DIAGNOSIS — Z951 Presence of aortocoronary bypass graft: Secondary | ICD-10-CM

## 2022-11-07 LAB — GLUCOSE, CAPILLARY
Glucose-Capillary: 186 mg/dL — ABNORMAL HIGH (ref 70–99)
Glucose-Capillary: 198 mg/dL — ABNORMAL HIGH (ref 70–99)

## 2022-11-07 NOTE — Progress Notes (Signed)
Daily Session Note  Patient Details  Name: Suzanne Tapia MRN: [id number] Date of Birth: July 20, 1955 Referring Provider:   Flowsheet Row CARDIAC REHAB PHASE II ORIENTATION from 11/02/2022 in [facility] CARDIAC REHABILITATION  Referring Provider Dina Rich MD  [Attending Cargiologist: Dr. Remi Deter McDowell]       Encounter Date: 11/07/2022  Check In:  Session Check In - 11/07/22 1456       Check-In   Supervising physician immediately available to respond to emergencies See telemetry face sheet for immediately available ER MD    Location [facility]    Staff Present Fabio Pierce, MA, RCEP, CCRP, CCET;Heather Fredric Mare, [place], Exercise Physiologist    Virtual Visit No    Medication changes reported     No    Fall or balance concerns reported    No    Warm-up and Cool-down Performed on first and last piece of equipment    Resistance Training Performed Yes    VAD Patient? No    PAD/SET Patient? No      Pain Assessment   Currently in Pain? No/denies             Capillary Blood Glucose: Results for orders placed or performed during the hospital encounter of 11/07/22 (from the past 24 hour(s))  Glucose, capillary     Status: Abnormal   Collection Time: 11/07/22  2:36 PM  Result Value Ref Range   Glucose-Capillary 186 (H) 70 - 99 mg/dL      Social History   Tobacco Use  Smoking Status Former   Packs/day: 0.30   Years: 10.00   Additional pack years: 0.00   Total pack years: 3.00   Types: Cigarettes   Start date: 08/10/1980   Quit date: 05/02/2000   Years since quitting: 22.5  Smokeless Tobacco Never    Goals Met:  Exercise tolerated well Personal goals reviewed No report of concerns or symptoms today Strength training completed today  Goals Unmet:  Not Applicable  Comments: First full day of exercise!  Patient was oriented to gym and equipment including functions, settings, policies, and procedures.  Patient's individual exercise prescription and  treatment plan were reviewed.  All starting workloads were established based on the results of the 6 minute walk test done at initial orientation visit.  The plan for exercise progression was also introduced and progression will be customized based on patient's performance and goals.  She noted some chest tightness 9/10 on treadmill that resolved with rest.  We slowed down work load too and it improved 6/10.     Dr. Dina Rich is Medical Director for [facility] Cardiac Rehab

## 2022-11-09 ENCOUNTER — Encounter (HOSPITAL_COMMUNITY)
Admission: RE | Admit: 2022-11-09 | Discharge: 2022-11-09 | Disposition: A | Discharge status: Home / Self Care | Payer: Medicare HMO | Source: Ambulatory Visit | Attending: Cardiology | Admitting: Cardiology

## 2022-11-09 DIAGNOSIS — I214 Non-ST elevation (NSTEMI) myocardial infarction: Secondary | ICD-10-CM | POA: Diagnosis not present

## 2022-11-09 DIAGNOSIS — Z951 Presence of aortocoronary bypass graft: Secondary | ICD-10-CM | POA: Diagnosis not present

## 2022-11-09 LAB — GLUCOSE, CAPILLARY
Glucose-Capillary: 119 mg/dL — ABNORMAL HIGH (ref 70–99)
Glucose-Capillary: 81 mg/dL (ref 70–99)

## 2022-11-09 NOTE — Progress Notes (Signed)
Daily Session Note  Patient Details  Name: Suzanne Tapia MRN: [id number] Date of Birth: 11-22-1955 Referring Provider:   Flowsheet Row CARDIAC REHAB PHASE II ORIENTATION from 11/02/2022 in [facility] CARDIAC REHABILITATION  Referring Provider Dina Rich MD  [Attending Cargiologist: Dr. Remi Deter McDowell]       Encounter Date: 11/09/2022  Check In:  Session Check In - 11/09/22 1445       Check-In   Supervising physician immediately available to respond to emergencies [facility] MD immediately available    Physician(s) Dr. Diona Browner    Location [facility]    Staff Present Avanell Shackleton BSN, RN;Debra Laural Benes, RN, BSN    Virtual Visit No    Medication changes reported     No    Fall or balance concerns reported    No    Tobacco Cessation No Change    Warm-up and Cool-down Performed as group-led instruction    Resistance Training Performed Yes    VAD Patient? No    PAD/SET Patient? No      Pain Assessment   Currently in Pain? No/denies    Multiple Pain Sites No             Capillary Blood Glucose: Results for orders placed or performed during the hospital encounter of 11/09/22 (from the past 24 hour(s))  Glucose, capillary     Status: Abnormal   Collection Time: 11/09/22  2:33 PM  Result Value Ref Range   Glucose-Capillary 119 (H) 70 - 99 mg/dL      Social History   Tobacco Use  Smoking Status Former   Packs/day: 0.30   Years: 10.00   Additional pack years: 0.00   Total pack years: 3.00   Types: Cigarettes   Start date: 08/10/1980   Quit date: 05/02/2000   Years since quitting: 22.5  Smokeless Tobacco Never    Goals Met:  Independence with exercise equipment Exercise tolerated well No report of concerns or symptoms today Strength training completed today  Goals Unmet:  Not Applicable  Comments: check out at 15:45   Dr. Dina Rich is Medical Director for [facility] Cardiac Rehab

## 2022-11-14 ENCOUNTER — Encounter (HOSPITAL_COMMUNITY)
Admission: RE | Admit: 2022-11-14 | Discharge: 2022-11-14 | Disposition: A | Discharge status: Home / Self Care | Payer: Medicare HMO | Source: Ambulatory Visit | Attending: Cardiology | Admitting: Cardiology

## 2022-11-14 DIAGNOSIS — Z951 Presence of aortocoronary bypass graft: Secondary | ICD-10-CM

## 2022-11-14 DIAGNOSIS — I214 Non-ST elevation (NSTEMI) myocardial infarction: Secondary | ICD-10-CM

## 2022-11-14 LAB — GLUCOSE, CAPILLARY
Glucose-Capillary: 130 mg/dL — ABNORMAL HIGH (ref 70–99)
Glucose-Capillary: 92 mg/dL (ref 70–99)

## 2022-11-14 NOTE — Progress Notes (Signed)
Daily Session Note  Patient Details  Name: Suzanne Tapia MRN: [id number] Date of Birth: [birth year] Referring Provider:   Flowsheet Row CARDIAC REHAB PHASE II ORIENTATION from 11/02/2022 in [facility] CARDIAC REHABILITATION  Referring Provider Dina Rich MD  [Attending Cargiologist: Dr. Remi Deter McDowell]       Encounter Date: 11/14/2022  Check In:  Session Check In - 11/14/22 1445       Check-In   Supervising physician immediately available to respond to emergencies [facility] MD immediately available    Physician(s) Dr. Jenene Slicker    Location [facility]    Staff Present Ross Ludwig, BS, Exercise Physiologist;Daphyne Daphine Deutscher, RN, BSN    Virtual Visit No    Medication changes reported     No    Fall or balance concerns reported    No    Tobacco Cessation No Change    Warm-up and Cool-down Performed on first and last piece of equipment    Resistance Training Performed Yes    VAD Patient? No    PAD/SET Patient? No      Pain Assessment   Currently in Pain? No/denies    Multiple Pain Sites No             Capillary Blood Glucose: Results for orders placed or performed during the hospital encounter of 11/14/22 (from the past 24 hour(s))  Glucose, capillary     Status: Abnormal   Collection Time: 11/14/22  2:42 PM  Result Value Ref Range   Glucose-Capillary 130 (H) 70 - 99 mg/dL      Social History   Tobacco Use  Smoking Status Former   Current packs/day: 0.00   Average packs/day: 0.3 packs/day for 19.7 years (5.9 ttl pk-yrs)   Types: Cigarettes   Start date: 08/10/1980   Quit date: 05/02/2000   Years since quitting: 22.5  Smokeless Tobacco Never    Goals Met:  Independence with exercise equipment Exercise tolerated well No report of concerns or symptoms today Strength training completed today  Goals Unmet:  Not Applicable  Comments: Pt able to follow exercise prescription today without complaint.  Will continue to monitor for progression.     Dr. Dina Rich is Medical Director for [facility] Cardiac Rehab

## 2022-11-16 ENCOUNTER — Encounter (HOSPITAL_COMMUNITY): Payer: Self-pay | Admitting: *Deleted

## 2022-11-16 ENCOUNTER — Encounter (HOSPITAL_COMMUNITY)
Admission: RE | Admit: 2022-11-16 | Discharge: 2022-11-16 | Disposition: A | Discharge status: Home / Self Care | Payer: Medicare HMO | Source: Ambulatory Visit | Attending: Cardiology | Admitting: Cardiology

## 2022-11-16 DIAGNOSIS — Z951 Presence of aortocoronary bypass graft: Secondary | ICD-10-CM

## 2022-11-16 DIAGNOSIS — I214 Non-ST elevation (NSTEMI) myocardial infarction: Secondary | ICD-10-CM | POA: Diagnosis not present

## 2022-11-16 NOTE — Progress Notes (Signed)
Daily Session Note  Patient Details  Name: Suzanne Tapia MRN: [id number] Date of Birth: [birth year] Referring Provider:   Flowsheet Row CARDIAC REHAB PHASE II ORIENTATION from 11/02/2022 in [facility] CARDIAC REHABILITATION  Referring Provider Dina Rich MD  [Attending Cargiologist: Dr. Remi Deter McDowell]       Encounter Date: 11/16/2022  Check In:  Session Check In - 11/16/22 1445       Check-In   Supervising physician immediately available to respond to emergencies [facility] MD immediately available    Physician(s) Dr. Jenene Slicker    Location [facility]    Staff Present Fabio Pierce, MA, RCEP, CCRP, Harolyn Rutherford, RN, BSN    Virtual Visit No    Medication changes reported     No    Fall or balance concerns reported    No    Tobacco Cessation No Change    Warm-up and Cool-down Performed on first and last piece of equipment    Resistance Training Performed Yes    VAD Patient? No      Pain Assessment   Currently in Pain? No/denies             Capillary Blood Glucose: No results found for this or any previous visit (from the past 24 hour(s)).    Social History   Tobacco Use  Smoking Status Former   Current packs/day: 0.00   Average packs/day: 0.3 packs/day for 19.7 years (5.9 ttl pk-yrs)   Types: Cigarettes   Start date: 08/10/1980   Quit date: 05/02/2000   Years since quitting: 22.5  Smokeless Tobacco Never    Goals Met:  Independence with exercise equipment Exercise tolerated well No report of concerns or symptoms today Strength training completed today  Goals Unmet:  Not Applicable  Comments: Pt able to follow exercise prescription today without complaint.  Will continue to monitor for progression.    Dr. Dina Rich is Medical Director for [facility] Cardiac Rehab

## 2022-11-16 NOTE — Progress Notes (Signed)
Cardiac Individual Treatment Plan  Patient Details  Name: Suzanne Tapia MRN: [id number] Date of Birth: [birth year] Referring Provider:   Flowsheet Row CARDIAC REHAB PHASE II ORIENTATION from 11/02/2022 in [facility] CARDIAC REHABILITATION  Referring Provider Dina Rich MD  [Attending Cargiologist: Dr. Remi Deter McDowell]       Initial Encounter Date:  Flowsheet Row CARDIAC REHAB PHASE II ORIENTATION from 11/02/2022 in [place] CARDIAC REHABILITATION  Date 11/02/22       Visit Diagnosis: NSTEMI (non-ST elevated myocardial infarction) (HCC)  S/P CABG x 3  Patient's Home Medications on Admission:  Current Outpatient Medications:    Ascorbic Acid (VITAMIN C PO), Take 1 tablet by mouth daily. (Patient not taking: Reported on 10/31/2022), Disp: , Rfl:    aspirin EC 81 MG tablet, Take 81 mg by mouth daily., Disp: , Rfl:    atorvastatin (LIPITOR) 80 MG tablet, Take 1 tablet (80 mg total) by mouth daily., Disp: 90 tablet, Rfl: 1   clopidogrel (PLAVIX) 75 MG tablet, Take 1 tablet (75 mg total) by mouth daily., Disp: 90 tablet, Rfl: 3   escitalopram (LEXAPRO) 10 MG tablet, Take 5 mg by mouth daily., Disp: , Rfl:    famotidine (PEPCID) 20 MG tablet, Take 20 mg by mouth daily., Disp: , Rfl:    fenofibrate 160 MG tablet, Take 160 mg by mouth daily., Disp: , Rfl:    furosemide (LASIX) 40 MG tablet, Daily as needed, Disp: 45 tablet, Rfl: 4   Insulin Glargine Solostar (LANTUS) 100 UNIT/ML Solostar Pen, Inject 110 Units into the skin daily., Disp: , Rfl:    loratadine (CLARITIN) 10 MG tablet, Take 10 mg by mouth daily., Disp: , Rfl:    metoprolol succinate (TOPROL-XL) 50 MG 24 hr tablet, Take 0.5 tablets (25 mg total) by mouth daily. Take with or immediately following a meal., Disp: 90 tablet, Rfl: 3   nitroGLYCERIN (NITROSTAT) 0.4 MG SL tablet, Place 1 tablet (0.4 mg total) under the tongue every 5 (five) minutes x 3 doses as needed for chest pain (If no relief after 2nd dose, proceed to the ED for  an evaluation). U, Disp: 25 tablet, Rfl: 3   NOVOLOG 100 UNIT/ML injection, Inject into the skin. 55 units in am and 60 in pm, Disp: , Rfl:    potassium chloride (KLOR-CON) 10 MEQ tablet, Daily as eeded, Disp: 90 tablet, Rfl: 3   SALINE NASAL MIST NA, Place 2 sprays into the nose as needed., Disp: , Rfl:    spironolactone (ALDACTONE) 25 MG tablet, Take 1 tablet (25 mg total) by mouth daily., Disp: 90 tablet, Rfl: 3  Past Medical History: Past Medical History:  Diagnosis Date   CAD (coronary artery disease)    Mild to moderate by cardiac catheterization December 2015   Essential hypertension    Heart failure with mildly reduced ejection fraction (HFmrEF) (HCC)    ICM EF 40-45%   History of palpitations    Negative CardioNet monitor 2012   Mixed hyperlipidemia    S/P CABG (coronary artery bypass graft)    s/p CABG x 3 including reverse saphenous vein graft sequenced to distal OM and proximal obtuse marginal artery, LIMA-mid LAD on August 16, 2022   Type 2 diabetes mellitus (HCC)     Tobacco Use: Social History   Tobacco Use  Smoking Status Former   Current packs/day: 0.00   Average packs/day: 0.3 packs/day for 19.7 years (5.9 ttl pk-yrs)   Types: Cigarettes   Start date: 08/10/1980   Quit  date: 05/02/2000   Years since quitting: 22.5  Smokeless Tobacco Never    Labs: Review Flowsheet       Latest Ref Rng & Units 04/28/2014 04/15/2020  Labs for ITP Cardiac and Pulmonary Rehab  Cholestrol <200 mg/dL - 045   LDL (calc) mg/dL (calc) - 96   HDL-C > OR = 50 mg/dL - 33   Trlycerides <409 mg/dL - 811   TCO2 0 - 914 mmol/L 21  -    Details            Capillary Blood Glucose: Lab Results  Component Value Date   GLUCAP 92 11/14/2022   GLUCAP 130 (H) 11/14/2022   GLUCAP 81 11/09/2022   GLUCAP 119 (H) 11/09/2022   GLUCAP 198 (H) 11/07/2022     Exercise Target Goals: Exercise Program Goal: Individual exercise prescription set using results from initial 6 min walk test  and THRR while considering  patient's activity barriers and safety.   Exercise Prescription Goal: Starting with aerobic activity 30 plus minutes a day, 3 days per week for initial exercise prescription. Provide home exercise prescription and guidelines that participant acknowledges understanding prior to discharge.  Activity Barriers & Risk Stratification:  Activity Barriers & Cardiac Risk Stratification - 11/02/22 1247       Activity Barriers & Cardiac Risk Stratification   Activity Barriers Deconditioning;Muscular Weakness;Other (comment)    Comments bilateral carpel tunnel surgery, R weaker than L    Cardiac Risk Stratification High             6 Minute Walk:  6 Minute Walk     Row Name 11/02/22 1439         6 Minute Walk   Phase Initial     Distance 1212 feet     Walk Time 6 minutes     # of Rest Breaks 0     MPH 2.29     METS 2.77     RPE 11     VO2 Peak 9.69     Symptoms Yes (comment)     Comments leg incisional prickles     Resting HR 67 bpm     Resting BP 124/58     Resting Oxygen Saturation  97 %     Exercise Oxygen Saturation  during 6 min walk 96 %     Max Ex. HR 83 bpm     Max Ex. BP 134/72     2 Minute Post BP 122/64              Oxygen Initial Assessment:   Oxygen Re-Evaluation:   Oxygen Discharge (Final Oxygen Re-Evaluation):   Initial Exercise Prescription:  Initial Exercise Prescription - 11/02/22 1400       Date of Initial Exercise RX and Referring Provider   Date 11/02/22    Referring Provider Dina Rich MD   Attending Cargiologist: Dr. Nona Dell     Oxygen   Maintain Oxygen Saturation 88% or higher      Treadmill   MPH 2.1    Grade 0.5    Minutes 17    METs 2.75      Recumbant Bike   Level 2    RPM 50    Watts 19    Minutes 17    METs 2.7      NuStep   Level 2    SPM 80    Minutes 17    METs 2.7      REL-XR   Level  2    Watts 20    Speed 50    Minutes 17    METs 2.7      Prescription  Details   Frequency (times per week) 2    Duration Progress to 30 minutes of continuous aerobic without signs/symptoms of physical distress      Intensity   THRR 40-80% of Max Heartrate 102-137    Ratings of Perceived Exertion 11-13    Perceived Dyspnea 0-4      Progression   Progression Continue to progress workloads to maintain intensity without signs/symptoms of physical distress.      Resistance Training   Training Prescription Yes    Weight 3 lb    Reps 10-15             Perform Capillary Blood Glucose checks as needed.  Exercise Prescription Changes:   Exercise Prescription Changes     Row Name 11/02/22 1400 11/14/22 1500           Response to Exercise   Blood Pressure (Admit) 124/58 120/60      Blood Pressure (Exercise) 134/72 120/60      Blood Pressure (Exit) 122/64 130/60      Heart Rate (Admit) 67 bpm 59 bpm      Heart Rate (Exercise) 83 bpm 93 bpm      Heart Rate (Exit) 73 bpm 68 bpm      Oxygen Saturation (Admit) 97 % --      Oxygen Saturation (Exercise) 96 % --      Rating of Perceived Exertion (Exercise) 11 12      Symptoms leg incision tingling --      Comments walk test results --      Duration -- Continue with 30 min of aerobic exercise without signs/symptoms of physical distress.      Intensity -- THRR unchanged        Progression   Progression -- Continue to progress workloads to maintain intensity without signs/symptoms of physical distress.        Resistance Training   Training Prescription -- Yes      Weight -- 3      Reps -- 10-15        Treadmill   MPH -- 1.5      Grade -- 0      Minutes -- 15      METs -- 2.15        REL-XR   Level -- 2      Speed -- 60      Minutes -- 15      METs -- 3.9        Oxygen   Maintain Oxygen Saturation -- 88% or higher               Exercise Comments:   Exercise Comments     Row Name 11/07/22 1458 11/07/22 1459         Exercise Comments First full day of exercise!  Patient was  oriented to gym and equipment including functions, settings, policies, and procedures.  Patient's individual exercise prescription and treatment plan were reviewed.  All starting workloads were established based on the results of the 6 minute walk test done at initial orientation visit.  The plan for exercise progression was also introduced and progression will be customized based on patient's performance and goals. She noted some chest tightness 9/10 on treadmill that resolved with rest.  We slowed down work load too and it improved 6/10.  Exercise Goals and Review:   Exercise Goals     Row Name 11/02/22 1449 11/15/22 0811           Exercise Goals   Increase Physical Activity Yes Yes      Intervention Provide advice, education, support and counseling about physical activity/exercise needs.;Develop an individualized exercise prescription for aerobic and resistive training based on initial evaluation findings, risk stratification, comorbidities and participant's personal goals. Provide advice, education, support and counseling about physical activity/exercise needs.;Develop an individualized exercise prescription for aerobic and resistive training based on initial evaluation findings, risk stratification, comorbidities and participant's personal goals.      Expected Outcomes Short Term: Attend rehab on a regular basis to increase amount of physical activity.;Long Term: Add in home exercise to make exercise part of routine and to increase amount of physical activity.;Long Term: Exercising regularly at least 3-5 days a week. Short Term: Attend rehab on a regular basis to increase amount of physical activity.;Long Term: Add in home exercise to make exercise part of routine and to increase amount of physical activity.;Long Term: Exercising regularly at least 3-5 days a week.      Increase Strength and Stamina Yes Yes      Intervention Provide advice, education, support and counseling  about physical activity/exercise needs.;Develop an individualized exercise prescription for aerobic and resistive training based on initial evaluation findings, risk stratification, comorbidities and participant's personal goals. Provide advice, education, support and counseling about physical activity/exercise needs.;Develop an individualized exercise prescription for aerobic and resistive training based on initial evaluation findings, risk stratification, comorbidities and participant's personal goals.      Expected Outcomes Short Term: Increase workloads from initial exercise prescription for resistance, speed, and METs.;Short Term: Perform resistance training exercises routinely during rehab and add in resistance training at home;Long Term: Improve cardiorespiratory fitness, muscular endurance and strength as measured by increased METs and functional capacity ( ) Short Term: Increase workloads from initial exercise prescription for resistance, speed, and METs.;Short Term: Perform resistance training exercises routinely during rehab and add in resistance training at home;Long Term: Improve cardiorespiratory fitness, muscular endurance and strength as measured by increased METs and functional capacity ( )      Able to understand and use rate of perceived exertion (RPE) scale Yes Yes      Intervention Provide education and explanation on how to use RPE scale Provide education and explanation on how to use RPE scale      Expected Outcomes Short Term: Able to use RPE daily in rehab to express subjective intensity level;Long Term:  Able to use RPE to guide intensity level when exercising independently Short Term: Able to use RPE daily in rehab to express subjective intensity level;Long Term:  Able to use RPE to guide intensity level when exercising independently      Able to understand and use Dyspnea scale Yes Yes      Intervention Provide education and explanation on how to use Dyspnea scale Provide  education and explanation on how to use Dyspnea scale      Expected Outcomes Short Term: Able to use Dyspnea scale daily in rehab to express subjective sense of shortness of breath during exertion;Long Term: Able to use Dyspnea scale to guide intensity level when exercising independently Short Term: Able to use Dyspnea scale daily in rehab to express subjective sense of shortness of breath during exertion;Long Term: Able to use Dyspnea scale to guide intensity level when exercising independently      Knowledge and understanding of Target  Heart Rate Range (THRR) Yes Yes      Intervention Provide education and explanation of THRR including how the numbers were predicted and where they are located for reference Provide education and explanation of THRR including how the numbers were predicted and where they are located for reference      Expected Outcomes Short Term: Able to state/look up THRR;Long Term: Able to use THRR to govern intensity when exercising independently;Short Term: Able to use daily as guideline for intensity in rehab Short Term: Able to state/look up THRR;Long Term: Able to use THRR to govern intensity when exercising independently;Short Term: Able to use daily as guideline for intensity in rehab      Able to check pulse independently Yes Yes      Intervention Provide education and demonstration on how to check pulse in carotid and radial arteries.;Review the importance of being able to check your own pulse for safety during independent exercise Provide education and demonstration on how to check pulse in carotid and radial arteries.;Review the importance of being able to check your own pulse for safety during independent exercise      Expected Outcomes Short Term: Able to explain why pulse checking is important during independent exercise;Long Term: Able to check pulse independently and accurately Short Term: Able to explain why pulse checking is important during independent exercise;Long  Term: Able to check pulse independently and accurately      Understanding of Exercise Prescription Yes Yes      Intervention Provide education, explanation, and written materials on patient's individual exercise prescription Provide education, explanation, and written materials on patient's individual exercise prescription      Expected Outcomes Short Term: Able to explain program exercise prescription;Long Term: Able to explain home exercise prescription to exercise independently Short Term: Able to explain program exercise prescription;Long Term: Able to explain home exercise prescription to exercise independently               Exercise Goals Re-Evaluation :  Exercise Goals Re-Evaluation     Row Name 11/02/22 1450 11/15/22 0812           Exercise Goal Re-Evaluation   Exercise Goals Review Able to understand and use rate of perceived exertion (RPE) scale;Able to understand and use Dyspnea scale;Understanding of Exercise Prescription Increase Physical Activity;Increase Strength and Stamina;Able to understand and use rate of perceived exertion (RPE) scale;Knowledge and understanding of Target Heart Rate Range (THRR);Able to check pulse independently;Understanding of Exercise Prescription      Comments Reviewed RPE  and dyspnea scale, and program prescription with pt today.  Pt voiced understanding and was given a copy of goals to take home. Patient has completed 4 sessions of cardiac rehab. She is motivated to be in the program and is ready to push herself. She is currently exercising at 3.9 MWTs on the XR. Will continue to monitor and progress as able.      Expected Outcomes Short: Use RPE daily to regulate intensity.  Long: Follow program prescription Through exercise at rehab and home, patient will achieve their goals.                Discharge Exercise Prescription (Final Exercise Prescription Changes):  Exercise Prescription Changes - 11/14/22 1500       Response to Exercise    Blood Pressure (Admit) 120/60    Blood Pressure (Exercise) 120/60    Blood Pressure (Exit) 130/60    Heart Rate (Admit) 59 bpm    Heart Rate (Exercise) 93  bpm    Heart Rate (Exit) 68 bpm    Rating of Perceived Exertion (Exercise) 12    Duration Continue with 30 min of aerobic exercise without signs/symptoms of physical distress.    Intensity THRR unchanged      Progression   Progression Continue to progress workloads to maintain intensity without signs/symptoms of physical distress.      Resistance Training   Training Prescription Yes    Weight 3    Reps 10-15      Treadmill   MPH 1.5    Grade 0    Minutes 15    METs 2.15      REL-XR   Level 2    Speed 60    Minutes 15    METs 3.9      Oxygen   Maintain Oxygen Saturation 88% or higher             Nutrition:  Target Goals: Understanding of nutrition guidelines, daily intake of sodium 1500mg , cholesterol 200mg , calories 30% from fat and 7% or less from saturated fats, daily to have 5 or more servings of fruits and vegetables.  Biometrics:  Pre Biometrics - 11/02/22 1451       Pre Biometrics   Height 5\' 5"  (1.651 m)    Weight 172 lb 12.8 oz (78.4 kg)    Waist Circumference 36.5 inches    Hip Circumference 43 inches    Waist to Hip Ratio 0.85 %    BMI (Calculated) 28.76    Triceps Skinfold 22 mm    % Body Fat 38.4 %    Grip Strength 23.9 kg              Nutrition Therapy Plan and Nutrition Goals:  Nutrition Therapy & Goals - 11/07/22 1508       Nutrition Therapy   RD appointment deferred Yes      Personal Nutrition Goals   Comments We provide educational information on heart heatlhy nutrition with handouts.      Intervention Plan   Intervention Nutrition handout(s) given to patient.    Expected Outcomes Short Term Goal: Understand basic principles of dietary content, such as calories, fat, sodium, cholesterol and nutrients.             Nutrition Assessments:  Nutrition Assessments -  11/02/22 1505       MEDFICTS Scores   Pre Score 38            MEDIFICTS Score Key: ?70 Need to make dietary changes  40-70 Heart Healthy Diet ? 40 Therapeutic Level Cholesterol Diet   Picture Your Plate Scores: <84 Unhealthy dietary pattern with much room for improvement. 41-50 Dietary pattern unlikely to meet recommendations for good health and room for improvement. 51-60 More healthful dietary pattern, with some room for improvement.  >60 Healthy dietary pattern, although there may be some specific behaviors that could be improved.    Nutrition Goals Re-Evaluation:   Nutrition Goals Discharge (Final Nutrition Goals Re-Evaluation):   Psychosocial: Target Goals: Acknowledge presence or absence of significant depression and/or stress, maximize coping skills, provide positive support system. Participant is able to verbalize types and ability to use techniques and skills needed for reducing stress and depression.  Initial Review & Psychosocial Screening:  Initial Psych Review & Screening - 11/02/22 1244       Initial Review   Current issues with Current Psychotropic Meds;Current Anxiety/Panic;Current Stress Concerns    Source of Stress Concerns Financial    Comments  restircted income with retirement      Family Dynamics   Good Support System? Yes   husband and nieces     Barriers   Psychosocial barriers to participate in program There are no identifiable barriers or psychosocial needs.;The patient should benefit from training in stress management and relaxation.      Screening Interventions   Interventions Encouraged to exercise;To provide support and resources with identified psychosocial needs;Provide feedback about the scores to participant    Expected Outcomes Short Term goal: Utilizing psychosocial counselor, staff and physician to assist with identification of specific Stressors or current issues interfering with healing process. Setting desired goal for each  stressor or current issue identified.;Long Term Goal: Stressors or current issues are controlled or eliminated.;Short Term goal: Identification and review with participant of any Quality of Life or Depression concerns found by scoring the questionnaire.;Long Term goal: The participant improves quality of Life and PHQ9 Scores as seen by post scores and/or verbalization of changes             Quality of Life Scores:  Quality of Life - 11/02/22 1504       Quality of Life   Select Quality of Life      Quality of Life Scores   Health/Function Pre 24.4 %    Socioeconomic Pre 26.21 %    Psych/Spiritual Pre 25.71 %    Family Pre 27 %    GLOBAL Pre 25.38 %            Scores of 19 and below usually indicate a poorer quality of life in these areas.  A difference of  2-3 points is a clinically meaningful difference.  A difference of 2-3 points in the total score of the Quality of Life Index has been associated with significant improvement in overall quality of life, self-image, physical symptoms, and general health in studies assessing change in quality of life.  PHQ-9: Review Flowsheet       11/02/2022  Depression screen PHQ 2/9  Decreased Interest 0  Down, Depressed, Hopeless 0  PHQ - 2 Score 0  Altered sleeping 0  Tired, decreased energy 0  Change in appetite 0  Feeling bad or failure about yourself  0  Trouble concentrating 0  Moving slowly or fidgety/restless 0  Suicidal thoughts 0  PHQ-9 Score 0  Difficult doing work/chores Not difficult at all    Details           Interpretation of Total Score  Total Score Depression Severity:  1-4 = Minimal depression, 5-9 = Mild depression, 10-14 = Moderate depression, 15-19 = Moderately severe depression, 20-27 = Severe depression   Psychosocial Evaluation and Intervention:  Psychosocial Evaluation - 11/02/22 1456       Psychosocial Evaluation & Interventions   Interventions Encouraged to exercise with the program and follow  exercise prescription    Comments Fenna is coming into Cardiac Rehab after having a heart attack on vacation in [place].  She also underwent CABG x3 and DES to RCA.  She is apprehensive about going back to beach and would like to build her confidence back up to be able to go again.  She is already walking at home for 20 min each day and has a great support system.  Her husband and nieces that live near by are constantly checking up on her to make sure she is good. She has a history of anxiety with all of this but feels that her meds are working  and helping her stay calm.  She is currently taking lexapro and feels well managed.  She is a good sleeper.  Her biggest barrier to rehab is her $35 copay and 30 min drive each way.  To help with this, we are only going to do 2 days a week versus 3 and only 24 session at first.  We will talk about other 12 after she gets into the program more.  She is also wants to work on some weight loss and learn her limits.    Expected Outcomes Short: Attend rehab to build confidence back up Long: continue to exercise to build stamina and confidence again    Continue Psychosocial Services  Follow up required by staff             Psychosocial Re-Evaluation:  Psychosocial Re-Evaluation     Row Name 11/07/22 1508             Psychosocial Re-Evaluation   Current issues with Current Anxiety/Panic;Current Psychotropic Meds       Comments Patient started the program today 11/07/22. She continues to have no psychosocial barriers identified. Her anxiety continues to be managed with Lexapro. We will continue to monitor her progress in the program.       Expected Outcomes Patient will continue to have no psychosocial barriers identified and her anxiety will continue to be managed with medication.       Interventions Relaxation education;Encouraged to attend Cardiac Rehabilitation for the exercise;Stress management education       Continue Psychosocial Services  No Follow  up required                Psychosocial Discharge (Final Psychosocial Re-Evaluation):  Psychosocial Re-Evaluation - 11/07/22 1508       Psychosocial Re-Evaluation   Current issues with Current Anxiety/Panic;Current Psychotropic Meds    Comments Patient started the program today 11/07/22. She continues to have no psychosocial barriers identified. Her anxiety continues to be managed with Lexapro. We will continue to monitor her progress in the program.    Expected Outcomes Patient will continue to have no psychosocial barriers identified and her anxiety will continue to be managed with medication.    Interventions Relaxation education;Encouraged to attend Cardiac Rehabilitation for the exercise;Stress management education    Continue Psychosocial Services  No Follow up required             Vocational Rehabilitation: Provide vocational rehab assistance to qualifying candidates.   Vocational Rehab Evaluation & Intervention:  Vocational Rehab - 11/02/22 1243       Initial Vocational Rehab Evaluation & Intervention   Assessment shows need for Vocational Rehabilitation No   retired            Education: Education Goals: Education classes will be provided on a weekly basis, covering required topics. Participant will state understanding/return demonstration of topics presented.  Learning Barriers/Preferences:  Learning Barriers/Preferences - 11/02/22 1242       Learning Barriers/Preferences   Learning Barriers Sight   readers   Learning Preferences None             Education Topics: Hypertension, Hypertension Reduction -Define heart disease and high blood pressure. Discus how high blood pressure affects the body and ways to reduce high blood pressure.   Exercise and Your Heart -Discuss why it is important to exercise, the FITT principles of exercise, normal and abnormal responses to exercise, and how to exercise safely. Flowsheet Row CARDIAC REHAB PHASE II  EXERCISE from 11/09/2022  in [place] [facility]  Date 11/09/22  Educator [facility]       Angina -Discuss definition of angina, causes of angina, treatment of angina, and how to decrease risk of having angina.   Cardiac Medications -Review what the following cardiac medications are used for, how they affect the body, and side effects that may occur when taking the medications.  Medications include Aspirin, Beta blockers, calcium channel blockers, ACE Inhibitors, angiotensin receptor blockers, diuretics, digoxin, and antihyperlipidemics.   Congestive Heart Failure -Discuss the definition of CHF, how to live with CHF, the signs and symptoms of CHF, and how keep track of weight and sodium intake.   Heart Disease and Intimacy -Discus the effect sexual activity has on the heart, how changes occur during intimacy as we age, and safety during sexual activity.   Smoking Cessation / COPD -Discuss different methods to quit smoking, the health benefits of quitting smoking, and the definition of COPD.   Nutrition I: Fats -Discuss the types of cholesterol, what cholesterol does to the heart, and how cholesterol levels can be controlled.   Nutrition II: Labels -Discuss the different components of food labels and how to read food label   Heart Parts/Heart Disease and PAD -Discuss the anatomy of the heart, the pathway of blood circulation through the heart, and these are affected by heart disease.   Stress I: Signs and Symptoms -Discuss the causes of stress, how stress may lead to anxiety and depression, and ways to limit stress.   Stress II: Relaxation -Discuss different types of relaxation techniques to limit stress.   Warning Signs of Stroke / TIA -Discuss definition of a stroke, what the signs and symptoms are of a stroke, and how to identify when someone is having stroke.   Knowledge Questionnaire Score:  Knowledge Questionnaire Score - 11/02/22 1505       Knowledge  Questionnaire Score   Pre Score 20/24             Core Components/Risk Factors/Patient Goals at Admission:  Personal Goals and Risk Factors at Admission - 11/02/22 1505       Core Components/Risk Factors/Patient Goals on Admission    Weight Management Yes;Obesity;Weight Loss    Intervention Weight Management: Develop a combined nutrition and exercise program designed to reach desired caloric intake, while maintaining appropriate intake of nutrient and fiber, sodium and fats, and appropriate energy expenditure required for the weight goal.;Weight Management/Obesity: Establish reasonable short term and long term weight goals.;Weight Management: Provide education and appropriate resources to help participant work on and attain dietary goals.;Obesity: Provide education and appropriate resources to help participant work on and attain dietary goals.    Admit Weight 172 lb 12.8 oz (78.4 kg)    Goal Weight: Short Term 167 lb (75.8 kg)    Goal Weight: Long Term 160 lb (72.6 kg)    Expected Outcomes Long Term: Adherence to nutrition and physical activity/exercise program aimed toward attainment of established weight goal;Short Term: Continue to assess and modify interventions until short term weight is achieved;Weight Loss: Understanding of general recommendations for a balanced deficit meal plan, which promotes 1-2 lb weight loss per week and includes a negative energy balance of [phone] kcal/d;Understanding recommendations for meals to include 15-35% energy as protein, 25-35% energy from fat, 35-60% energy from carbohydrates, less than 200mg  of dietary cholesterol, 20-35 gm of total fiber daily;Understanding of distribution of calorie intake throughout the day with the consumption of 4-5 meals/snacks    Diabetes Yes  Intervention Provide education about signs/symptoms and action to take for hypo/hyperglycemia.;Provide education about proper nutrition, including hydration, and aerobic/resistive  exercise prescription along with prescribed medications to achieve blood glucose in normal ranges: Fasting glucose 65-99 mg/dL    Expected Outcomes Short Term: Participant verbalizes understanding of the signs/symptoms and immediate care of hyper/hypoglycemia, proper foot care and importance of medication, aerobic/resistive exercise and nutrition plan for blood glucose control.;Long Term: Attainment of HbA1C < 7%.    Heart Failure Yes    Intervention Provide a combined exercise and nutrition program that is supplemented with education, support and counseling about heart failure. Directed toward relieving symptoms such as shortness of breath, decreased exercise tolerance, and extremity edema.    Expected Outcomes Improve functional capacity of life;Short term: Attendance in program 2-3 days a week with increased exercise capacity. Reported lower sodium intake. Reported increased fruit and vegetable intake. Reports medication compliance.;Short term: Daily weights obtained and reported for increase. Utilizing diuretic protocols set by physician.;Long term: Adoption of self-care skills and reduction of barriers for early signs and symptoms recognition and intervention leading to self-care maintenance.    Hypertension Yes    Intervention Provide education on lifestyle modifcations including regular physical activity/exercise, weight management, moderate sodium restriction and increased consumption of fresh fruit, vegetables, and low fat dairy, alcohol moderation, and smoking cessation.;Monitor prescription use compliance.    Expected Outcomes Short Term: Continued assessment and intervention until BP is < 140/56mm HG in hypertensive participants. < 130/52mm HG in hypertensive participants with diabetes, heart failure or chronic kidney disease.;Long Term: Maintenance of blood pressure at goal levels.    Lipids Yes    Intervention Provide education and support for participant on nutrition & aerobic/resistive  exercise along with prescribed medications to achieve LDL 70mg , HDL >40mg .    Expected Outcomes Short Term: Participant states understanding of desired cholesterol values and is compliant with medications prescribed. Participant is following exercise prescription and nutrition guidelines.;Long Term: Cholesterol controlled with medications as prescribed, with individualized exercise RX and with personalized nutrition plan. Value goals: LDL < 70mg , HDL > 40 mg.             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 She has multiple risk factors for CAD and is participating in the program for risk modification. She started the program today 7/8. Her DM is managed with insulin. She has no A1C on file. Her personal goals for the program are to build her confidence back up to go to the beach; lose weight and learn her limits. We will continue to monitor her progress as she works towards meeting these goals.       Expected Outcomes Patient will complete the program meeting both personal and program goals.                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 She has multiple risk factors for CAD and is participating in the program for risk  modification. She started the program today 7/8. Her DM is managed with insulin. She has no A1C on file. Her personal goals for the program are to build her confidence back up to go to the beach; lose  weight and learn her limits. We will continue to monitor her progress as she works towards meeting these goals.    Expected Outcomes Patient will complete the program meeting both personal and program goals.             ITP Comments:  ITP Comments     Row Name 11/02/22 1425 11/07/22 1457 11/16/22 1317       ITP Comments Patient attend orientation today.  Patient is attending Cardiac Rehabilitation Program.  Documentation for diagnosis can be found in [facility] office visit 10/20/22.  Reviewed medical chart, RPE, gym safety, and program guidelines.  Patient was fitted to equipment they will be using during rehab.  Patient is scheduled to start exercise on Monday November 07, 2022. First full day of exercise!  Patient was oriented to gym and equipment including functions, settings, policies, and procedures.  Patient's individual exercise prescription and treatment plan were reviewed.  All starting workloads were established based on the results of the 6 minute walk test done at initial orientation visit.  The plan for exercise progression was also introduced and progression will be customized based on patient's performance and goals. 30 day review completed. ITP sent to Dr. Dina Rich, Medical Director of Cardiac Rehab. Continue with ITP unless changes are made by physician.              Comments: 30 day review

## 2022-11-21 ENCOUNTER — Encounter (HOSPITAL_COMMUNITY)
Admission: RE | Admit: 2022-11-21 | Discharge: 2022-11-21 | Disposition: A | Discharge status: Home / Self Care | Payer: Medicare HMO | Source: Ambulatory Visit | Attending: Cardiology | Admitting: Cardiology

## 2022-11-21 DIAGNOSIS — Z951 Presence of aortocoronary bypass graft: Secondary | ICD-10-CM | POA: Diagnosis not present

## 2022-11-21 DIAGNOSIS — I214 Non-ST elevation (NSTEMI) myocardial infarction: Secondary | ICD-10-CM | POA: Diagnosis not present

## 2022-11-21 NOTE — Progress Notes (Signed)
Daily Session Note  Patient Details  Name: ADRIANAH PROPHETE MRN: [id number] Date of Birth: 1955-06-28 Referring Provider:   Flowsheet Row CARDIAC REHAB PHASE II ORIENTATION from 11/02/2022 in [facility] CARDIAC REHABILITATION  Referring Provider Dina Rich MD  [Attending Cargiologist: Dr. Remi Deter McDowell]       Encounter Date: 11/21/2022  Check In:  Session Check In - 11/21/22 1445       Check-In   Supervising physician immediately available to respond to emergencies See telemetry face sheet for immediately available MD    Location [facility]    Staff Present Ross Ludwig, BS, Exercise Physiologist;Kamesha Herne Daphine Deutscher, RN, BSN    Virtual Visit No    Medication changes reported     No    Fall or balance concerns reported    No    Tobacco Cessation No Change    Warm-up and Cool-down Performed on first and last piece of equipment    Resistance Training Performed Yes    VAD Patient? No      Pain Assessment   Currently in Pain? No/denies             Capillary Blood Glucose: No results found for this or any previous visit (from the past 24 hour(s)).    Social History   Tobacco Use  Smoking Status Former   Current packs/day: 0.00   Average packs/day: 0.3 packs/day for 19.7 years (5.9 ttl pk-yrs)   Types: Cigarettes   Start date: 08/10/1980   Quit date: 05/02/2000   Years since quitting: 22.5  Smokeless Tobacco Never    Goals Met:  Independence with exercise equipment Exercise tolerated well No report of concerns or symptoms today Strength training completed today  Goals Unmet:  Not Applicable  Comments: Pt able to follow exercise prescription today without complaint.  Will continue to monitor for progression.    Dr. Dina Rich is Medical Director for [facility] Cardiac Rehab

## 2022-11-23 ENCOUNTER — Encounter (HOSPITAL_COMMUNITY)
Admission: RE | Admit: 2022-11-23 | Discharge: 2022-11-23 | Disposition: A | Discharge status: Home / Self Care | Payer: Medicare HMO | Source: Ambulatory Visit | Attending: Cardiology | Admitting: Cardiology

## 2022-11-23 DIAGNOSIS — Z951 Presence of aortocoronary bypass graft: Secondary | ICD-10-CM | POA: Diagnosis not present

## 2022-11-23 DIAGNOSIS — I214 Non-ST elevation (NSTEMI) myocardial infarction: Secondary | ICD-10-CM

## 2022-11-23 NOTE — Progress Notes (Signed)
Daily Session Note  Patient Details  Name: Suzanne Tapia MRN: [id number] Date of Birth: 1955/10/17 Referring Provider:   Flowsheet Row CARDIAC REHAB PHASE II ORIENTATION from 11/02/2022 in [facility] CARDIAC REHABILITATION  Referring Provider Dina Rich MD  [Attending Cargiologist: Dr. Remi Deter McDowell]       Encounter Date: 11/23/2022  Check In:  Session Check In - 11/23/22 1445       Check-In   Supervising physician immediately available to respond to emergencies See telemetry face sheet for immediately available MD    Location [facility]    Staff Present Ross Ludwig, BS, Exercise Physiologist;Raelee Rossmann Daphine Deutscher, RN, BSN;Hillary Troutman BSN, RN    Virtual Visit No    Medication changes reported     No    Fall or balance concerns reported    No    Tobacco Cessation No Change    Warm-up and Cool-down Performed on first and last piece of equipment    Resistance Training Performed Yes    VAD Patient? No    PAD/SET Patient? No      Pain Assessment   Currently in Pain? No/denies             Capillary Blood Glucose: No results found for this or any previous visit (from the past 24 hour(s)).    Social History   Tobacco Use  Smoking Status Former   Current packs/day: 0.00   Average packs/day: 0.3 packs/day for 19.7 years (5.9 ttl pk-yrs)   Types: Cigarettes   Start date: 08/10/1980   Quit date: 05/02/2000   Years since quitting: 22.5  Smokeless Tobacco Never    Goals Met:  Independence with exercise equipment Exercise tolerated well No report of concerns or symptoms today Strength training completed today  Goals Unmet:  Not Applicable  Comments: Pt able to follow exercise prescription today without complaint.  Will continue to monitor for progression.    Dr. Dina Rich is Medical Director for [facility] Cardiac Rehab

## 2022-11-24 ENCOUNTER — Ambulatory Visit: Payer: Medicare HMO | Admitting: Nurse Practitioner

## 2022-11-28 ENCOUNTER — Encounter (HOSPITAL_COMMUNITY)
Admission: RE | Admit: 2022-11-28 | Discharge: 2022-11-28 | Disposition: A | Discharge status: Home / Self Care | Payer: Medicare HMO | Source: Ambulatory Visit | Attending: Cardiology | Admitting: Cardiology

## 2022-11-28 DIAGNOSIS — I214 Non-ST elevation (NSTEMI) myocardial infarction: Secondary | ICD-10-CM | POA: Diagnosis not present

## 2022-11-28 DIAGNOSIS — Z951 Presence of aortocoronary bypass graft: Secondary | ICD-10-CM | POA: Diagnosis not present

## 2022-11-28 NOTE — Progress Notes (Signed)
Daily Session Note  Patient Details  Name: Suzanne Tapia MRN: [id number] Date of Birth: 1955/11/10 Referring Provider:   Flowsheet Row CARDIAC REHAB PHASE II ORIENTATION from 11/02/2022 in [facility] CARDIAC REHABILITATION  Referring Provider Dina Rich MD  [Attending Cargiologist: Dr. Remi Deter McDowell]       Encounter Date: 11/28/2022  Check In:  Session Check In - 11/28/22 1445       Check-In   Supervising physician immediately available to respond to emergencies [facility] MD immediately available    Physician(s) Dr. Wyline Mood    Location [facility]    Staff Present Ross Ludwig, BS, Exercise Physiologist;Ismael Karge, RN;Daphyne Daphine Deutscher, RN, BSN    Virtual Visit No    Medication changes reported     No    Fall or balance concerns reported    No    Tobacco Cessation No Change    Warm-up and Cool-down Performed on first and last piece of equipment    Resistance Training Performed Yes    VAD Patient? No    PAD/SET Patient? No      Pain Assessment   Currently in Pain? No/denies    Multiple Pain Sites No             Capillary Blood Glucose: No results found for this or any previous visit (from the past 24 hour(s)).    Social History   Tobacco Use  Smoking Status Former   Current packs/day: 0.00   Average packs/day: 0.3 packs/day for 19.7 years (5.9 ttl pk-yrs)   Types: Cigarettes   Start date: 08/10/1980   Quit date: 05/02/2000   Years since quitting: 22.5  Smokeless Tobacco Never    Goals Met:  Independence with exercise equipment Exercise tolerated well No report of concerns or symptoms today Strength training completed today  Goals Unmet:  Not Applicable  Comments: Pt able to follow exercise prescription today without complaint.  Will continue to monitor for progression.    Dr. Dina Rich is Medical Director for [facility] Cardiac Rehab

## 2022-11-30 ENCOUNTER — Encounter (HOSPITAL_COMMUNITY): Admission: RE | Admit: 2022-11-30 | Payer: Medicare HMO | Source: Ambulatory Visit

## 2022-11-30 DIAGNOSIS — I214 Non-ST elevation (NSTEMI) myocardial infarction: Secondary | ICD-10-CM

## 2022-11-30 DIAGNOSIS — Z951 Presence of aortocoronary bypass graft: Secondary | ICD-10-CM

## 2022-11-30 NOTE — Progress Notes (Signed)
Daily Session Note  Patient Details  Name: Suzanne Tapia MRN: [id number] Date of Birth: [birth year] Referring Provider:   Flowsheet Row CARDIAC REHAB PHASE II ORIENTATION from 11/02/2022 in [facility] CARDIAC REHABILITATION  Referring Provider Dina Rich MD  [Attending Cargiologist: Dr. Remi Deter McDowell]       Encounter Date: 11/30/2022  Check In:  Session Check In - 11/30/22 1448       Check-In   Supervising physician immediately available to respond to emergencies See telemetry face sheet for immediately available MD    Physician(s) Dr. Wyline Mood    Location [facility]    Staff Present Ross Ludwig, BS, Exercise Physiologist;Hillary Troutman BSN, RN    Virtual Visit No    Medication changes reported     No    Fall or balance concerns reported    No    Tobacco Cessation No Change    Warm-up and Cool-down Performed on first and last piece of equipment    Resistance Training Performed Yes    VAD Patient? No    PAD/SET Patient? No      Pain Assessment   Currently in Pain? No/denies    Multiple Pain Sites No             Capillary Blood Glucose: No results found for this or any previous visit (from the past 24 hour(s)).    Social History   Tobacco Use  Smoking Status Former   Current packs/day: 0.00   Average packs/day: 0.3 packs/day for 19.7 years (5.9 ttl pk-yrs)   Types: Cigarettes   Start date: 08/10/1980   Quit date: 05/02/2000   Years since quitting: 22.5  Smokeless Tobacco Never    Goals Met:  Independence with exercise equipment Exercise tolerated well No report of concerns or symptoms today Strength training completed today  Goals Unmet:  Not Applicable  Comments: Pt able to follow exercise prescription today without complaint.  Will continue to monitor for progression.    Dr. Dina Rich is Medical Director for [facility] Cardiac Rehab

## 2022-12-05 ENCOUNTER — Encounter (HOSPITAL_COMMUNITY)
Admission: RE | Admit: 2022-12-05 | Discharge: 2022-12-05 | Disposition: A | Discharge status: Home / Self Care | Payer: Medicare HMO | Source: Ambulatory Visit | Attending: Cardiology | Admitting: Cardiology

## 2022-12-05 DIAGNOSIS — Z951 Presence of aortocoronary bypass graft: Secondary | ICD-10-CM | POA: Insufficient documentation

## 2022-12-05 DIAGNOSIS — I214 Non-ST elevation (NSTEMI) myocardial infarction: Secondary | ICD-10-CM | POA: Diagnosis not present

## 2022-12-05 NOTE — Progress Notes (Signed)
Daily Session Note  Patient Details  Name: Suzanne Tapia MRN: [id number] Date of Birth: 05/18/1955 Referring Provider:   Flowsheet Row CARDIAC REHAB PHASE II ORIENTATION from 11/02/2022 in [facility] CARDIAC REHABILITATION  Referring Provider Dina Rich MD  [Attending Cargiologist: Dr. Remi Deter McDowell]       Encounter Date: 12/05/2022  Check In:  Session Check In - 12/05/22 1505       Check-In   Supervising physician immediately available to respond to emergencies See telemetry face sheet for immediately available MD    Location [facility]    Staff Present Erskine Speed, RN;Cathryne Mancebo [place], MA, RCEP, CCRP, CCET    Virtual Visit No    Medication changes reported     No    Fall or balance concerns reported    No    Warm-up and Cool-down Performed on first and last piece of equipment    Resistance Training Performed Yes    VAD Patient? No    PAD/SET Patient? No      Pain Assessment   Currently in Pain? No/denies             Capillary Blood Glucose: No results found for this or any previous visit (from the past 24 hour(s)).    Social History   Tobacco Use  Smoking Status Former   Current packs/day: 0.00   Average packs/day: 0.3 packs/day for 19.7 years (5.9 ttl pk-yrs)   Types: Cigarettes   Start date: 08/10/1980   Quit date: 05/02/2000   Years since quitting: 22.6  Smokeless Tobacco Never    Goals Met:  Independence with exercise equipment Exercise tolerated well No report of concerns or symptoms today Strength training completed today  Goals Unmet:  Not Applicable  Comments: Pt able to follow exercise prescription today without complaint.  Will continue to monitor for progression.    Dr. Dina Rich is Medical Director for [facility] Cardiac Rehab

## 2022-12-07 ENCOUNTER — Encounter (HOSPITAL_COMMUNITY)
Admission: RE | Admit: 2022-12-07 | Discharge: 2022-12-07 | Disposition: A | Discharge status: Home / Self Care | Payer: Medicare HMO | Source: Ambulatory Visit | Attending: Cardiology | Admitting: Cardiology

## 2022-12-07 VITALS — Ht 65.0 in | Wt 174.6 lb

## 2022-12-07 DIAGNOSIS — Z951 Presence of aortocoronary bypass graft: Secondary | ICD-10-CM | POA: Diagnosis not present

## 2022-12-07 DIAGNOSIS — I214 Non-ST elevation (NSTEMI) myocardial infarction: Secondary | ICD-10-CM

## 2022-12-07 NOTE — Progress Notes (Signed)
Daily Session Note  Patient Details  Name: SYNA HIGHSMITH MRN: [id number] Date of Birth: 17-Sep-1955 Referring Provider:   Flowsheet Row CARDIAC REHAB PHASE II ORIENTATION from 11/02/2022 in [facility] CARDIAC REHABILITATION  Referring Provider Dina Rich MD  [Attending Cargiologist: Dr. Remi Deter McDowell]       Encounter Date: 12/07/2022  Check In:  Session Check In - 12/07/22 1453       Check-In   Supervising physician immediately available to respond to emergencies See telemetry face sheet for immediately available MD    Location [facility]    Staff Present Fabio Pierce, MA, RCEP, CCRP, CCET;Hillary Cawood BSN, RN;Maecie Sevcik Stearns, RN, BSN    Virtual Visit No    Medication changes reported     No    Fall or balance concerns reported    No    Tobacco Cessation No Change    Warm-up and Cool-down Performed on first and last piece of equipment    Resistance Training Performed Yes    VAD Patient? No      Pain Assessment   Currently in Pain? No/denies             Capillary Blood Glucose: No results found for this or any previous visit (from the past 24 hour(s)).    Social History   Tobacco Use  Smoking Status Former   Current packs/day: 0.00   Average packs/day: 0.3 packs/day for 19.7 years (5.9 ttl pk-yrs)   Types: Cigarettes   Start date: 08/10/1980   Quit date: 05/02/2000   Years since quitting: 22.6  Smokeless Tobacco Never    Goals Met:  Independence with exercise equipment Exercise tolerated well No report of concerns or symptoms today Strength training completed today  Goals Unmet:  Not Applicable  Comments: Pt able to follow exercise prescription today without complaint.  Will continue to monitor for progression.    Dr. Dina Rich is Medical Director for [facility] Cardiac Rehab

## 2022-12-08 DIAGNOSIS — I10 Essential (primary) hypertension: Secondary | ICD-10-CM | POA: Diagnosis not present

## 2022-12-08 DIAGNOSIS — E109 Type 1 diabetes mellitus without complications: Secondary | ICD-10-CM | POA: Diagnosis not present

## 2022-12-08 DIAGNOSIS — I25810 Atherosclerosis of coronary artery bypass graft(s) without angina pectoris: Secondary | ICD-10-CM | POA: Diagnosis not present

## 2022-12-08 DIAGNOSIS — E781 Pure hyperglyceridemia: Secondary | ICD-10-CM | POA: Diagnosis not present

## 2022-12-08 NOTE — Patient Instructions (Signed)
Discharge Patient Instructions  Patient Details  Name: Suzanne Tapia MRN: [id number] Date of Birth: 1955-06-07 Referring Provider:  Rebekah Chesterfield, NP   Number of Visits: 13  Reason for Discharge:  Patient reached a stable level of exercise. Patient independent in their exercise. Patient has met program and personal goals.  Smoking History:  Social History   Tobacco Use  Smoking Status Former   Current packs/day: 0.00   Average packs/day: 0.3 packs/day for 19.7 years (5.9 ttl pk-yrs)   Types: Cigarettes   Start date: 08/10/1980   Quit date: 05/02/2000   Years since quitting: 22.6  Smokeless Tobacco Never    Diagnosis:  NSTEMI (non-ST elevated myocardial infarction) (HCC)  S/P CABG x 3  Initial Exercise Prescription:  Initial Exercise Prescription - 11/02/22 1400       Date of Initial Exercise RX and Referring Provider   Date 11/02/22    Referring Provider Dina Rich MD   Attending Cargiologist: Dr. Nona Dell     Oxygen   Maintain Oxygen Saturation 88% or higher      Treadmill   MPH 2.1    Grade 0.5    Minutes 17    METs 2.75      Recumbant Bike   Level 2    RPM 50    Watts 19    Minutes 17    METs 2.7      NuStep   Level 2    SPM 80    Minutes 17    METs 2.7      REL-XR   Level 2    Watts 20    Speed 50    Minutes 17    METs 2.7      Prescription Details   Frequency (times per week) 2    Duration Progress to 30 minutes of continuous aerobic without signs/symptoms of physical distress      Intensity   THRR 40-80% of Max Heartrate 102-137    Ratings of Perceived Exertion 11-13    Perceived Dyspnea 0-4      Progression   Progression Continue to progress workloads to maintain intensity without signs/symptoms of physical distress.      Resistance Training   Training Prescription Yes    Weight 3 lb    Reps 10-15             Discharge Exercise Prescription (Final Exercise Prescription Changes):  Exercise Prescription  Changes - 11/28/22 1500       Response to Exercise   Blood Pressure (Admit) 128/58    Blood Pressure (Exercise) 142/58    Blood Pressure (Exit) 98/64    Heart Rate (Admit) 70 bpm    Heart Rate (Exercise) 104 bpm    Heart Rate (Exit) 76 bpm    Rating of Perceived Exertion (Exercise) 16    Duration Continue with 30 min of aerobic exercise without signs/symptoms of physical distress.    Intensity THRR unchanged      Progression   Progression Continue to progress workloads to maintain intensity without signs/symptoms of physical distress.      Resistance Training   Training Prescription Yes    Weight 4    Reps 10-15      Treadmill   MPH 2.2    Grade 0.5    Minutes 15    METs 2.84      REL-XR   Level 2    Speed 61    Minutes 15    METs 3.8  Oxygen   Maintain Oxygen Saturation 88% or higher             Functional Capacity:  6 Minute Walk     Row Name 11/02/22 1439 12/08/22 0901       6 Minute Walk   Phase Initial Discharge    Distance 1212 feet 1552 feet    Walk Time 6 minutes 6 minutes    # of Rest Breaks 0 0    MPH 2.29 2.94    METS 2.77 3.73    RPE 11 17    VO2 Peak 9.69 13.05    Symptoms Yes (comment) No    Comments leg incisional prickles --    Resting HR 67 bpm 64 bpm    Resting BP 124/58 124/64    Resting Oxygen Saturation  97 % --    Exercise Oxygen Saturation  during 6 min walk 96 % --    Max Ex. HR 83 bpm 112 bpm    Max Ex. BP 134/72 146/78    2 Minute Post BP 122/64 --             Nutrition & Weight - Outcomes:  Pre Biometrics - 11/02/22 1451       Pre Biometrics   Height 5\' 5"  (1.651 m)    Weight 78.4 kg    Waist Circumference 36.5 inches    Hip Circumference 43 inches    Waist to Hip Ratio 0.85 %    BMI (Calculated) 28.76    Triceps Skinfold 22 mm    % Body Fat 38.4 %    Grip Strength 23.9 kg             Post Biometrics - 12/08/22 0905        Post  Biometrics   Height 5\' 5"  (1.651 m)    Weight 79.2 kg     Waist Circumference 35.5 inches    Hip Circumference 41.5 inches    Waist to Hip Ratio 0.86 %    BMI (Calculated) 29.05    Grip Strength 25.9 kg    Single Leg Stand 1.8 seconds

## 2022-12-08 NOTE — Progress Notes (Signed)
Reviewed home exercise with pt today.  Pt plans to walk and use bike and pedal machine at home for exercise.  She also has bands and weights.  She is planning to get back in her pool to walk some as well and we reviewed some weights to do in the water as well.  Reviewed THR, pulse, RPE, sign and symptoms, pulse oximetery and when to call 911 or MD.  Also discussed weather considerations and indoor options.  Pt voiced understanding.

## 2022-12-12 ENCOUNTER — Encounter (HOSPITAL_COMMUNITY)
Admission: RE | Admit: 2022-12-12 | Discharge: 2022-12-12 | Disposition: A | Discharge status: Home / Self Care | Payer: Medicare HMO | Source: Ambulatory Visit | Attending: Cardiology | Admitting: Cardiology

## 2022-12-12 DIAGNOSIS — Z951 Presence of aortocoronary bypass graft: Secondary | ICD-10-CM

## 2022-12-12 DIAGNOSIS — I214 Non-ST elevation (NSTEMI) myocardial infarction: Secondary | ICD-10-CM | POA: Diagnosis not present

## 2022-12-12 NOTE — Progress Notes (Signed)
Daily Session Note  Patient Details  Name: Suzanne Tapia MRN: [id number] Date of Birth: 1955-08-14 Referring Provider:   Flowsheet Row CARDIAC REHAB PHASE II ORIENTATION from 11/02/2022 in [facility]  Referring Provider Dina Rich MD  [Attending Cargiologist: Dr. Remi Deter McDowell]       Encounter Date: 12/12/2022  Check In:  Session Check In - 12/12/22 1442       Check-In   Supervising physician immediately available to respond to emergencies See telemetry face sheet for immediately available ER MD    Location [facility]    Staff Present Staci Righter, RN, Neal Dy, RN, BSN;Jessica Juanetta Gosling, MA, RCEP, CCRP, CCET;Heather Fredric Mare, [place], Exercise Physiologist    Virtual Visit No    Medication changes reported     No    Fall or balance concerns reported    No    Warm-up and Cool-down Performed on first and last piece of equipment    Resistance Training Performed Yes    VAD Patient? No    PAD/SET Patient? No      Pain Assessment   Currently in Pain? No/denies    Multiple Pain Sites No             Capillary Blood Glucose: No results found for this or any previous visit (from the past 24 hour(s)).    Social History   Tobacco Use  Smoking Status Former   Current packs/day: 0.00   Average packs/day: 0.3 packs/day for 19.7 years (5.9 ttl pk-yrs)   Types: Cigarettes   Start date: 08/10/1980   Quit date: 05/02/2000   Years since quitting: 22.6  Smokeless Tobacco Never    Goals Met:  Independence with exercise equipment Exercise tolerated well No report of concerns or symptoms today Strength training completed today  Goals Unmet:  Not Applicable  Comments: Pt able to follow exercise prescription today without complaint.  Will continue to monitor for progression.    Dr. Dina Rich is Medical Director for [facility]

## 2022-12-13 ENCOUNTER — Other Ambulatory Visit: Payer: Self-pay | Admitting: Nurse Practitioner

## 2022-12-14 ENCOUNTER — Encounter (HOSPITAL_COMMUNITY): Payer: Self-pay | Admitting: *Deleted

## 2022-12-14 ENCOUNTER — Encounter (HOSPITAL_COMMUNITY): Payer: Medicare HMO

## 2022-12-14 DIAGNOSIS — I214 Non-ST elevation (NSTEMI) myocardial infarction: Secondary | ICD-10-CM

## 2022-12-14 DIAGNOSIS — Z951 Presence of aortocoronary bypass graft: Secondary | ICD-10-CM

## 2022-12-14 NOTE — Progress Notes (Signed)
Cardiac Individual Treatment Plan  Patient Details  Name: SAKAI STRANDE MRN: [id number] Date of Birth: 04-17-1956 Referring Provider:   Flowsheet Row CARDIAC REHAB PHASE II ORIENTATION from 11/02/2022 in [facility] CARDIAC REHABILITATION  Referring Provider Dina Rich MD  [Attending Cargiologist: Dr. Remi Deter McDowell]       Initial Encounter Date:  Flowsheet Row CARDIAC REHAB PHASE II ORIENTATION from 11/02/2022 in [place] CARDIAC REHABILITATION  Date 11/02/22       Visit Diagnosis: S/P CABG x 3  NSTEMI (non-ST elevated myocardial infarction) [facility])  Patient's Home Medications on Admission:  Current Outpatient Medications:    Ascorbic Acid (VITAMIN C PO), Take 1 tablet by mouth daily. (Patient not taking: Reported on 10/31/2022), Disp: , Rfl:    aspirin EC 81 MG tablet, Take 81 mg by mouth daily., Disp: , Rfl:    atorvastatin (LIPITOR) 80 MG tablet, TAKE ONE (1) TABLET BY MOUTH EVERY DAY, Disp: 90 tablet, Rfl: 1   clopidogrel (PLAVIX) 75 MG tablet, Take 1 tablet (75 mg total) by mouth daily., Disp: 90 tablet, Rfl: 3   escitalopram (LEXAPRO) 10 MG tablet, Take 5 mg by mouth daily., Disp: , Rfl:    famotidine (PEPCID) 20 MG tablet, Take 20 mg by mouth daily., Disp: , Rfl:    fenofibrate 160 MG tablet, Take 160 mg by mouth daily., Disp: , Rfl:    furosemide (LASIX) 40 MG tablet, Daily as needed, Disp: 45 tablet, Rfl: 4   Insulin Glargine Solostar (LANTUS) 100 UNIT/ML Solostar Pen, Inject 110 Units into the skin daily., Disp: , Rfl:    loratadine (CLARITIN) 10 MG tablet, Take 10 mg by mouth daily., Disp: , Rfl:    metoprolol succinate (TOPROL-XL) 50 MG 24 hr tablet, Take 0.5 tablets (25 mg total) by mouth daily. Take with or immediately following a meal., Disp: 90 tablet, Rfl: 3   nitroGLYCERIN (NITROSTAT) 0.4 MG SL tablet, Place 1 tablet (0.4 mg total) under the tongue every 5 (five) minutes x 3 doses as needed for chest pain (If no relief after 2nd dose, proceed to the ED for an  evaluation). U, Disp: 25 tablet, Rfl: 3   NOVOLOG 100 UNIT/ML injection, Inject into the skin. 55 units in am and 60 in pm, Disp: , Rfl:    potassium chloride (KLOR-CON) 10 MEQ tablet, Daily as eeded, Disp: 90 tablet, Rfl: 3   SALINE NASAL MIST NA, Place 2 sprays into the nose as needed., Disp: , Rfl:    spironolactone (ALDACTONE) 25 MG tablet, Take 1 tablet (25 mg total) by mouth daily., Disp: 90 tablet, Rfl: 3  Past Medical History: Past Medical History:  Diagnosis Date   CAD (coronary artery disease)    Mild to moderate by cardiac catheterization December 2015   Essential hypertension    Heart failure with mildly reduced ejection fraction (HFmrEF) (HCC)    ICM EF 40-45%   History of palpitations    Negative CardioNet monitor 2012   Mixed hyperlipidemia    S/P CABG (coronary artery bypass graft)    s/p CABG x 3 including reverse saphenous vein graft sequenced to distal OM and proximal obtuse marginal artery, LIMA-mid LAD on August 16, 2022   Type 2 diabetes mellitus (HCC)     Tobacco Use: Social History   Tobacco Use  Smoking Status Former   Current packs/day: 0.00   Average packs/day: 0.3 packs/day for 19.7 years (5.9 ttl pk-yrs)   Types: Cigarettes   Start date: 08/10/1980   Quit date:  05/02/2000   Years since quitting: 22.6  Smokeless Tobacco Never    Labs: Review Flowsheet       Latest Ref Rng & Units 04/28/2014 04/15/2020  Labs for ITP Cardiac and Pulmonary Rehab  Cholestrol <200 mg/dL - 213   LDL (calc) mg/dL (calc) - 96   HDL-C > OR = 50 mg/dL - 33   Trlycerides <086 mg/dL - 578   TCO2 0 - 469 mmol/L 21  -    Details            Capillary Blood Glucose: Lab Results  Component Value Date   GLUCAP 92 11/14/2022   GLUCAP 130 (H) 11/14/2022   GLUCAP 81 11/09/2022   GLUCAP 119 (H) 11/09/2022   GLUCAP 198 (H) 11/07/2022     Exercise Target Goals: Exercise Program Goal: Individual exercise prescription set using results from initial 6 min walk test and  THRR while considering  patient's activity barriers and safety.   Exercise Prescription Goal: Starting with aerobic activity 30 plus minutes a day, 3 days per week for initial exercise prescription. Provide home exercise prescription and guidelines that participant acknowledges understanding prior to discharge.  Activity Barriers & Risk Stratification:  Activity Barriers & Cardiac Risk Stratification - 11/02/22 1247       Activity Barriers & Cardiac Risk Stratification   Activity Barriers Deconditioning;Muscular Weakness;Other (comment)    Comments bilateral carpel tunnel surgery, R weaker than L    Cardiac Risk Stratification High             6 Minute Walk:  6 Minute Walk     Row Name 11/02/22 1439 12/08/22 0901       6 Minute Walk   Phase Initial Discharge    Distance 1212 feet 1552 feet    Walk Time 6 minutes 6 minutes    # of Rest Breaks 0 0    MPH 2.29 2.94    METS 2.77 3.73    RPE 11 17    VO2 Peak 9.69 13.05    Symptoms Yes (comment) No    Comments leg incisional prickles --    Resting HR 67 bpm 64 bpm    Resting BP 124/58 124/64    Resting Oxygen Saturation  97 % --    Exercise Oxygen Saturation  during 6 min walk 96 % --    Max Ex. HR 83 bpm 112 bpm    Max Ex. BP 134/72 146/78    2 Minute Post BP 122/64 --             Oxygen Initial Assessment:   Oxygen Re-Evaluation:   Oxygen Discharge (Final Oxygen Re-Evaluation):   Initial Exercise Prescription:  Initial Exercise Prescription - 11/02/22 1400       Date of Initial Exercise RX and Referring Provider   Date 11/02/22    Referring Provider Dina Rich MD   Attending Cargiologist: Dr. Nona Dell     Oxygen   Maintain Oxygen Saturation 88% or higher      Treadmill   MPH 2.1    Grade 0.5    Minutes 17    METs 2.75      Recumbant Bike   Level 2    RPM 50    Watts 19    Minutes 17    METs 2.7      NuStep   Level 2    SPM 80    Minutes 17    METs 2.7      REL-XR  Level 2    Watts 20    Speed 50    Minutes 17    METs 2.7      Prescription Details   Frequency (times per week) 2    Duration Progress to 30 minutes of continuous aerobic without signs/symptoms of physical distress      Intensity   THRR 40-80% of Max Heartrate 102-137    Ratings of Perceived Exertion 11-13    Perceived Dyspnea 0-4      Progression   Progression Continue to progress workloads to maintain intensity without signs/symptoms of physical distress.      Resistance Training   Training Prescription Yes    Weight 3 lb    Reps 10-15             Perform Capillary Blood Glucose checks as needed.  Exercise Prescription Changes:   Exercise Prescription Changes     Row Name 11/02/22 1400 11/14/22 1500 11/28/22 1500         Response to Exercise   Blood Pressure (Admit) 124/58 120/60 128/58     Blood Pressure (Exercise) 134/72 120/60 142/58     Blood Pressure (Exit) 122/64 130/60 98/64     Heart Rate (Admit) 67 bpm 59 bpm 70 bpm     Heart Rate (Exercise) 83 bpm 93 bpm 104 bpm     Heart Rate (Exit) 73 bpm 68 bpm 76 bpm     Oxygen Saturation (Admit) 97 % -- --     Oxygen Saturation (Exercise) 96 % -- --     Rating of Perceived Exertion (Exercise) 11 12 16      Symptoms leg incision tingling -- --     Comments walk test results -- --     Duration -- Continue with 30 min of aerobic exercise without signs/symptoms of physical distress. Continue with 30 min of aerobic exercise without signs/symptoms of physical distress.     Intensity -- THRR unchanged THRR unchanged       Progression   Progression -- Continue to progress workloads to maintain intensity without signs/symptoms of physical distress. Continue to progress workloads to maintain intensity without signs/symptoms of physical distress.       Resistance Training   Training Prescription -- Yes Yes     Weight -- 3 4     Reps -- 10-15 10-15       Treadmill   MPH -- 1.5 2.2     Grade -- 0 0.5     Minutes --  15 15     METs -- 2.15 2.84       REL-XR   Level -- 2 2     Speed -- 60 61     Minutes -- 15 15     METs -- 3.9 3.8       Oxygen   Maintain Oxygen Saturation -- 88% or higher 88% or higher              Exercise Comments:   Exercise Comments     Row Name 11/07/22 1458 11/07/22 1459         Exercise Comments First full day of exercise!  Patient was oriented to gym and equipment including functions, settings, policies, and procedures.  Patient's individual exercise prescription and treatment plan were reviewed.  All starting workloads were established based on the results of the 6 minute walk test done at initial orientation visit.  The plan for exercise progression was also introduced and progression will be customized based  on patient's performance and goals. She noted some chest tightness 9/10 on treadmill that resolved with rest.  We slowed down work load too and it improved 6/10.               Exercise Goals and Review:   Exercise Goals     Row Name 11/02/22 1449 11/15/22 0811           Exercise Goals   Increase Physical Activity Yes Yes      Intervention Provide advice, education, support and counseling about physical activity/exercise needs.;Develop an individualized exercise prescription for aerobic and resistive training based on initial evaluation findings, risk stratification, comorbidities and participant's personal goals. Provide advice, education, support and counseling about physical activity/exercise needs.;Develop an individualized exercise prescription for aerobic and resistive training based on initial evaluation findings, risk stratification, comorbidities and participant's personal goals.      Expected Outcomes Short Term: Attend rehab on a regular basis to increase amount of physical activity.;Long Term: Add in home exercise to make exercise part of routine and to increase amount of physical activity.;Long Term: Exercising regularly at least 3-5 days a  week. Short Term: Attend rehab on a regular basis to increase amount of physical activity.;Long Term: Add in home exercise to make exercise part of routine and to increase amount of physical activity.;Long Term: Exercising regularly at least 3-5 days a week.      Increase Strength and Stamina Yes Yes      Intervention Provide advice, education, support and counseling about physical activity/exercise needs.;Develop an individualized exercise prescription for aerobic and resistive training based on initial evaluation findings, risk stratification, comorbidities and participant's personal goals. Provide advice, education, support and counseling about physical activity/exercise needs.;Develop an individualized exercise prescription for aerobic and resistive training based on initial evaluation findings, risk stratification, comorbidities and participant's personal goals.      Expected Outcomes Short Term: Increase workloads from initial exercise prescription for resistance, speed, and METs.;Short Term: Perform resistance training exercises routinely during rehab and add in resistance training at home;Long Term: Improve cardiorespiratory fitness, muscular endurance and strength as measured by increased METs and functional capacity ( ) Short Term: Increase workloads from initial exercise prescription for resistance, speed, and METs.;Short Term: Perform resistance training exercises routinely during rehab and add in resistance training at home;Long Term: Improve cardiorespiratory fitness, muscular endurance and strength as measured by increased METs and functional capacity ( )      Able to understand and use rate of perceived exertion (RPE) scale Yes Yes      Intervention Provide education and explanation on how to use RPE scale Provide education and explanation on how to use RPE scale      Expected Outcomes Short Term: Able to use RPE daily in rehab to express subjective intensity level;Long Term:  Able to use  RPE to guide intensity level when exercising independently Short Term: Able to use RPE daily in rehab to express subjective intensity level;Long Term:  Able to use RPE to guide intensity level when exercising independently      Able to understand and use Dyspnea scale Yes Yes      Intervention Provide education and explanation on how to use Dyspnea scale Provide education and explanation on how to use Dyspnea scale      Expected Outcomes Short Term: Able to use Dyspnea scale daily in rehab to express subjective sense of shortness of breath during exertion;Long Term: Able to use Dyspnea scale to guide intensity level when exercising independently Short  Term: Able to use Dyspnea scale daily in rehab to express subjective sense of shortness of breath during exertion;Long Term: Able to use Dyspnea scale to guide intensity level when exercising independently      Knowledge and understanding of Target Heart Rate Range (THRR) Yes Yes      Intervention Provide education and explanation of THRR including how the numbers were predicted and where they are located for reference Provide education and explanation of THRR including how the numbers were predicted and where they are located for reference      Expected Outcomes Short Term: Able to state/look up THRR;Long Term: Able to use THRR to govern intensity when exercising independently;Short Term: Able to use daily as guideline for intensity in rehab Short Term: Able to state/look up THRR;Long Term: Able to use THRR to govern intensity when exercising independently;Short Term: Able to use daily as guideline for intensity in rehab      Able to check pulse independently Yes Yes      Intervention Provide education and demonstration on how to check pulse in carotid and radial arteries.;Review the importance of being able to check your own pulse for safety during independent exercise Provide education and demonstration on how to check pulse in carotid and radial  arteries.;Review the importance of being able to check your own pulse for safety during independent exercise      Expected Outcomes Short Term: Able to explain why pulse checking is important during independent exercise;Long Term: Able to check pulse independently and accurately Short Term: Able to explain why pulse checking is important during independent exercise;Long Term: Able to check pulse independently and accurately      Understanding of Exercise Prescription Yes Yes      Intervention Provide education, explanation, and written materials on patient's individual exercise prescription Provide education, explanation, and written materials on patient's individual exercise prescription      Expected Outcomes Short Term: Able to explain program exercise prescription;Long Term: Able to explain home exercise prescription to exercise independently Short Term: Able to explain program exercise prescription;Long Term: Able to explain home exercise prescription to exercise independently               Exercise Goals Re-Evaluation :  Exercise Goals Re-Evaluation     Row Name 11/02/22 1450 11/15/22 0812 11/29/22 0845 12/08/22 0847       Exercise Goal Re-Evaluation   Exercise Goals Review Able to understand and use rate of perceived exertion (RPE) scale;Able to understand and use Dyspnea scale;Understanding of Exercise Prescription Increase Physical Activity;Increase Strength and Stamina;Able to understand and use rate of perceived exertion (RPE) scale;Knowledge and understanding of Target Heart Rate Range (THRR);Able to check pulse independently;Understanding of Exercise Prescription -- Increase Physical Activity;Able to understand and use rate of perceived exertion (RPE) scale;Knowledge and understanding of Target Heart Rate Range (THRR);Understanding of Exercise Prescription;Increase Strength and Stamina;Able to understand and use Dyspnea scale;Able to check pulse independently    Comments Reviewed RPE   and dyspnea scale, and program prescription with pt today.  Pt voiced understanding and was given a copy of goals to take home. Patient has completed 4 sessions of cardiac rehab. She is motivated to be in the program and is ready to push herself. She is currently exercising at 3.9 MWTs on the XR. Will continue to monitor and progress as able. Pt has increased her speed on the treadmill the last two weeks in rehab [place] will be graduating next week due to her insurance copays.  She improved her post 6WMT by 28.1 %!  Reviewed home exercise with pt today.  Pt plans to walk and use bike and pedal machine at home for exercise.  She also has bands and weights.  She is planning to get back in her pool to walk some as well and we reviewed some weights to do in the water as well.  Reviewed THR, pulse, RPE, sign and symptoms, pulse oximetery and when to call 911 or MD.  Also discussed weather considerations and indoor options.  Pt voiced understanding.    Expected Outcomes Short: Use RPE daily to regulate intensity.  Long: Follow program prescription Through exercise at rehab and home, patient will achieve their goals. -- Short: Graduate Long: Continue to exercise independently              Discharge Exercise Prescription (Final Exercise Prescription Changes):  Exercise Prescription Changes - 11/28/22 1500       Response to Exercise   Blood Pressure (Admit) 128/58    Blood Pressure (Exercise) 142/58    Blood Pressure (Exit) 98/64    Heart Rate (Admit) 70 bpm    Heart Rate (Exercise) 104 bpm    Heart Rate (Exit) 76 bpm    Rating of Perceived Exertion (Exercise) 16    Duration Continue with 30 min of aerobic exercise without signs/symptoms of physical distress.    Intensity THRR unchanged      Progression   Progression Continue to progress workloads to maintain intensity without signs/symptoms of physical distress.      Resistance Training   Training Prescription Yes    Weight 4    Reps 10-15       Treadmill   MPH 2.2    Grade 0.5    Minutes 15    METs 2.84      REL-XR   Level 2    Speed 61    Minutes 15    METs 3.8      Oxygen   Maintain Oxygen Saturation 88% or higher             Nutrition:  Target Goals: Understanding of nutrition guidelines, daily intake of sodium 1500mg , cholesterol 200mg , calories 30% from fat and 7% or less from saturated fats, daily to have 5 or more servings of fruits and vegetables.  Biometrics:  Pre Biometrics - 11/02/22 1451       Pre Biometrics   Height 5\' 5"  (1.651 m)    Weight 172 lb 12.8 oz (78.4 kg)    Waist Circumference 36.5 inches    Hip Circumference 43 inches    Waist to Hip Ratio 0.85 %    BMI (Calculated) 28.76    Triceps Skinfold 22 mm    % Body Fat 38.4 %    Grip Strength 23.9 kg             Post Biometrics - 12/08/22 0905        Post  Biometrics   Height 5\' 5"  (1.651 m)    Weight 174 lb 9.6 oz (79.2 kg)    Waist Circumference 35.5 inches    Hip Circumference 41.5 inches    Waist to Hip Ratio 0.86 %    BMI (Calculated) 29.05    Grip Strength 25.9 kg    Single Leg Stand 1.8 seconds             Nutrition Therapy Plan and Nutrition Goals:  Nutrition Therapy & Goals - 11/07/22 1508  Nutrition Therapy   RD appointment deferred Yes      Personal Nutrition Goals   Comments We provide educational information on heart heatlhy nutrition with handouts.      Intervention Plan   Intervention Nutrition handout(s) given to patient.    Expected Outcomes Short Term Goal: Understand basic principles of dietary content, such as calories, fat, sodium, cholesterol and nutrients.             Nutrition Assessments:  Nutrition Assessments - 12/12/22 1622       MEDFICTS Scores   Pre Score 38    Post Score 32    Score Difference -6            MEDIFICTS Score Key: ?70 Need to make dietary changes  40-70 Heart Healthy Diet ? 40 Therapeutic Level Cholesterol Diet   Picture Your Plate  Scores: <21 Unhealthy dietary pattern with much room for improvement. 41-50 Dietary pattern unlikely to meet recommendations for good health and room for improvement. 51-60 More healthful dietary pattern, with some room for improvement.  >60 Healthy dietary pattern, although there may be some specific behaviors that could be improved.    Nutrition Goals Re-Evaluation:  Nutrition Goals Re-Evaluation     Row Name 12/08/22 0908             Goals   Nutrition Goal Heart Healthy diet       Comment Haila has been working on her diet since her heart event.  She is staying away from both salt and sugar.  She does allow a cheat every once in a while and we talked about that being okay to do.  She is getting lots of fruits and vegetables and trying to stick to leaner proteins.       Expected Outcome Continue to limit sodium and follow heart healthy diet                Nutrition Goals Discharge (Final Nutrition Goals Re-Evaluation):  Nutrition Goals Re-Evaluation - 12/08/22 0908       Goals   Nutrition Goal Heart Healthy diet    Comment Zenaida has been working on her diet since her heart event.  She is staying away from both salt and sugar.  She does allow a cheat every once in a while and we talked about that being okay to do.  She is getting lots of fruits and vegetables and trying to stick to leaner proteins.    Expected Outcome Continue to limit sodium and follow heart healthy diet             Psychosocial: Target Goals: Acknowledge presence or absence of significant depression and/or stress, maximize coping skills, provide positive support system. Participant is able to verbalize types and ability to use techniques and skills needed for reducing stress and depression.  Initial Review & Psychosocial Screening:  Initial Psych Review & Screening - 11/02/22 1244       Initial Review   Current issues with Current Psychotropic Meds;Current Anxiety/Panic;Current Stress Concerns     Source of Stress Concerns Financial    Comments restircted income with retirement      Family Dynamics   Good Support System? Yes   husband and nieces     Barriers   Psychosocial barriers to participate in program There are no identifiable barriers or psychosocial needs.;The patient should benefit from training in stress management and relaxation.      Screening Interventions   Interventions Encouraged to exercise;To provide support  and resources with identified psychosocial needs;Provide feedback about the scores to participant    Expected Outcomes Short Term goal: Utilizing psychosocial counselor, staff and physician to assist with identification of specific Stressors or current issues interfering with healing process. Setting desired goal for each stressor or current issue identified.;Long Term Goal: Stressors or current issues are controlled or eliminated.;Short Term goal: Identification and review with participant of any Quality of Life or Depression concerns found by scoring the questionnaire.;Long Term goal: The participant improves quality of Life and PHQ9 Scores as seen by post scores and/or verbalization of changes             Quality of Life Scores:  Quality of Life - 11/02/22 1504       Quality of Life   Select Quality of Life      Quality of Life Scores   Health/Function Pre 24.4 %    Socioeconomic Pre 26.21 %    Psych/Spiritual Pre 25.71 %    Family Pre 27 %    GLOBAL Pre 25.38 %            Scores of 19 and below usually indicate a poorer quality of life in these areas.  A difference of  2-3 points is a clinically meaningful difference.  A difference of 2-3 points in the total score of the Quality of Life Index has been associated with significant improvement in overall quality of life, self-image, physical symptoms, and general health in studies assessing change in quality of life.  PHQ-9: Review Flowsheet       12/12/2022 11/02/2022  Depression screen PHQ 2/9   Decreased Interest 0 0  Down, Depressed, Hopeless 0 0  PHQ - 2 Score 0 0  Altered sleeping 0 0  Tired, decreased energy 1 0  Change in appetite 0 0  Feeling bad or failure about yourself  0 0  Trouble concentrating 0 0  Moving slowly or fidgety/restless 0 0  Suicidal thoughts 0 0  PHQ-9 Score 1 0  Difficult doing work/chores Not difficult at all Not difficult at all    Details           Interpretation of Total Score  Total Score Depression Severity:  1-4 = Minimal depression, 5-9 = Mild depression, 10-14 = Moderate depression, 15-19 = Moderately severe depression, 20-27 = Severe depression   Psychosocial Evaluation and Intervention:  Psychosocial Evaluation - 11/02/22 1456       Psychosocial Evaluation & Interventions   Interventions Encouraged to exercise with the program and follow exercise prescription    Comments Anniemae is coming into Cardiac Rehab after having a heart attack on vacation in [place].  She also underwent CABG x3 and DES to RCA.  She is apprehensive about going back to beach and would like to build her confidence back up to be able to go again.  She is already walking at home for 20 min each day and has a great support system.  Her husband and nieces that live near by are constantly checking up on her to make sure she is good. She has a history of anxiety with all of this but feels that her meds are working and helping her stay calm.  She is currently taking lexapro and feels well managed.  She is a good sleeper.  Her biggest barrier to rehab is her $35 copay and 30 min drive each way.  To help with this, we are only going to do 2 days a week versus  3 and only 24 session at first.  We will talk about other 12 after she gets into the program more.  She is also wants to work on some weight loss and learn her limits.    Expected Outcomes Short: Attend rehab to build confidence back up Long: continue to exercise to build stamina and confidence again    Continue  Psychosocial Services  Follow up required by staff             Psychosocial Re-Evaluation:  Psychosocial Re-Evaluation     Row Name 11/07/22 1508 12/07/22 1506           Psychosocial Re-Evaluation   Current issues with Current Anxiety/Panic;Current Psychotropic Meds Current Anxiety/Panic;Current Psychotropic Meds;Current Stress Concerns      Comments Patient started the program today 11/07/22. She continues to have no psychosocial barriers identified. Her anxiety continues to be managed with Lexapro. We will continue to monitor her progress in the program. Karman is doing well in rehab. She is planning to graduate next week due to her copay.  She has enjoyed the program and feeling a little more confident in herself now.  She continues to feel well managed on her lexapro.  Her favorite part was getting to know others in the program they were going through the same thing she was.  She wishes she could stay, but cannot afford to, but feels likes she is now able to maintain her exercise on her own as she has started to learn her limits.      Expected Outcomes Patient will continue to have no psychosocial barriers identified and her anxiety will continue to be managed with medication. Continue to exercise for mental boost      Interventions Relaxation education;Encouraged to attend Cardiac Rehabilitation for the exercise;Stress management education Encouraged to attend Cardiac Rehabilitation for the exercise      Continue Psychosocial Services  No Follow up required --               Psychosocial Discharge (Final Psychosocial Re-Evaluation):  Psychosocial Re-Evaluation - 12/07/22 1506       Psychosocial Re-Evaluation   Current issues with Current Anxiety/Panic;Current Psychotropic Meds;Current Stress Concerns    Comments Milei is doing well in rehab. She is planning to graduate next week due to her copay.  She has enjoyed the program and feeling a little more confident in herself now.  She  continues to feel well managed on her lexapro.  Her favorite part was getting to know others in the program they were going through the same thing she was.  She wishes she could stay, but cannot afford to, but feels likes she is now able to maintain her exercise on her own as she has started to learn her limits.    Expected Outcomes Continue to exercise for mental boost    Interventions Encouraged to attend Cardiac Rehabilitation for the exercise             Vocational Rehabilitation: Provide vocational rehab assistance to qualifying candidates.   Vocational Rehab Evaluation & Intervention:  Vocational Rehab - 11/02/22 1243       Initial Vocational Rehab Evaluation & Intervention   Assessment shows need for Vocational Rehabilitation No   retired            Education: Education Goals: Education classes will be provided on a weekly basis, covering required topics. Participant will state understanding/return demonstration of topics presented.  Learning Barriers/Preferences:  Learning Barriers/Preferences - 11/02/22 1242  Learning Barriers/Preferences   Learning Barriers Sight   readers   Learning Preferences None             Education Topics: Hypertension, Hypertension Reduction -Define heart disease and high blood pressure. Discus how high blood pressure affects the body and ways to reduce high blood pressure.   Exercise and Your Heart -Discuss why it is important to exercise, the FITT principles of exercise, normal and abnormal responses to exercise, and how to exercise safely. Flowsheet Row CARDIAC REHAB PHASE II EXERCISE from 12/07/2022 in [place] CARDIAC REHABILITATION  Date 11/09/22  Educator [facility]       Angina -Discuss definition of angina, causes of angina, treatment of angina, and how to decrease risk of having angina. Flowsheet Row CARDIAC REHAB PHASE II EXERCISE from 12/07/2022 in [place] CARDIAC REHABILITATION  Date 11/16/22  Educator [facility]   Instruction Review Code 1- Verbalizes Understanding       Cardiac Medications -Review what the following cardiac medications are used for, how they affect the body, and side effects that may occur when taking the medications.  Medications include Aspirin, Beta blockers, calcium channel blockers, ACE Inhibitors, angiotensin receptor blockers, diuretics, digoxin, and antihyperlipidemics.   Congestive Heart Failure -Discuss the definition of CHF, how to live with CHF, the signs and symptoms of CHF, and how keep track of weight and sodium intake. Flowsheet Row CARDIAC REHAB PHASE II EXERCISE from 12/07/2022 in [place] CARDIAC REHABILITATION  Date 11/30/22  Educator [facility]  Instruction Review Code 1- Verbalizes Understanding       Heart Disease and Intimacy -Discus the effect sexual activity has on the heart, how changes occur during intimacy as we age, and safety during sexual activity. Flowsheet Row CARDIAC REHAB PHASE II EXERCISE from 12/07/2022 in [place] CARDIAC REHABILITATION  Date 12/07/22  Educator [facility]  Instruction Review Code 1- Verbalizes Understanding       Smoking Cessation / COPD -Discuss different methods to quit smoking, the health benefits of quitting smoking, and the definition of COPD.   Nutrition I: Fats -Discuss the types of cholesterol, what cholesterol does to the heart, and how cholesterol levels can be controlled.   Nutrition II: Labels -Discuss the different components of food labels and how to read food label   Heart Parts/Heart Disease and PAD -Discuss the anatomy of the heart, the pathway of blood circulation through the heart, and these are affected by heart disease.   Stress I: Signs and Symptoms -Discuss the causes of stress, how stress may lead to anxiety and depression, and ways to limit stress.   Stress II: Relaxation -Discuss different types of relaxation techniques to limit stress.   Warning Signs of Stroke / TIA -Discuss definition  of a stroke, what the signs and symptoms are of a stroke, and how to identify when someone is having stroke.   Knowledge Questionnaire Score:  Knowledge Questionnaire Score - 12/12/22 1619       Knowledge Questionnaire Score   Pre Score 20/24    Post Score 23/24             Core Components/Risk Factors/Patient Goals at Admission:  Personal Goals and Risk Factors at Admission - 11/02/22 1505       Core Components/Risk Factors/Patient Goals on Admission    Weight Management Yes;Obesity;Weight Loss    Intervention Weight Management: Develop a combined nutrition and exercise program designed to reach desired caloric intake, while maintaining appropriate intake of nutrient and fiber, sodium and fats, and  appropriate energy expenditure required for the weight goal.;Weight Management/Obesity: Establish reasonable short term and long term weight goals.;Weight Management: Provide education and appropriate resources to help participant work on and attain dietary goals.;Obesity: Provide education and appropriate resources to help participant work on and attain dietary goals.    Admit Weight 172 lb 12.8 oz (78.4 kg)    Goal Weight: Short Term 167 lb (75.8 kg)    Goal Weight: Long Term 160 lb (72.6 kg)    Expected Outcomes Long Term: Adherence to nutrition and physical activity/exercise program aimed toward attainment of established weight goal;Short Term: Continue to assess and modify interventions until short term weight is achieved;Weight Loss: Understanding of general recommendations for a balanced deficit meal plan, which promotes 1-2 lb weight loss per week and includes a negative energy balance of [id number] kcal/d;Understanding recommendations for meals to include 15-35% energy as protein, 25-35% energy from fat, 35-60% energy from carbohydrates, less than 200mg  of dietary cholesterol, 20-35 gm of total fiber daily;Understanding of distribution of calorie intake throughout the day with the  consumption of 4-5 meals/snacks    Diabetes Yes    Intervention Provide education about signs/symptoms and action to take for hypo/hyperglycemia.;Provide education about proper nutrition, including hydration, and aerobic/resistive exercise prescription along with prescribed medications to achieve blood glucose in normal ranges: Fasting glucose 65-99 mg/dL    Expected Outcomes Short Term: Participant verbalizes understanding of the signs/symptoms and immediate care of hyper/hypoglycemia, proper foot care and importance of medication, aerobic/resistive exercise and nutrition plan for blood glucose control.;Long Term: Attainment of HbA1C < 7%.    Heart Failure Yes    Intervention Provide a combined exercise and nutrition program that is supplemented with education, support and counseling about heart failure. Directed toward relieving symptoms such as shortness of breath, decreased exercise tolerance, and extremity edema.    Expected Outcomes Improve functional capacity of life;Short term: Attendance in program 2-3 days a week with increased exercise capacity. Reported lower sodium intake. Reported increased fruit and vegetable intake. Reports medication compliance.;Short term: Daily weights obtained and reported for increase. Utilizing diuretic protocols set by physician.;Long term: Adoption of self-care skills and reduction of barriers for early signs and symptoms recognition and intervention leading to self-care maintenance.    Hypertension Yes    Intervention Provide education on lifestyle modifcations including regular physical activity/exercise, weight management, moderate sodium restriction and increased consumption of fresh fruit, vegetables, and low fat dairy, alcohol moderation, and smoking cessation.;Monitor prescription use compliance.    Expected Outcomes Short Term: Continued assessment and intervention until BP is < 140/70mm HG in hypertensive participants. < 130/74mm HG in hypertensive  participants with diabetes, heart failure or chronic kidney disease.;Long Term: Maintenance of blood pressure at goal levels.    Lipids Yes    Intervention Provide education and support for participant on nutrition & aerobic/resistive exercise along with prescribed medications to achieve LDL 70mg , HDL >40mg .    Expected Outcomes Short Term: Participant states understanding of desired cholesterol values and is compliant with medications prescribed. Participant is following exercise prescription and nutrition guidelines.;Long Term: Cholesterol controlled with medications as prescribed, with individualized exercise RX and with personalized nutrition plan. Value goals: LDL < 70mg , HDL > 40 mg.             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 She has multiple risk factors for CAD and is participating in the program for risk modification. She started the program today 7/8. Her DM is managed with insulin. She has no A1C on file. Her personal goals for the program are to build her confidence back up to go to the beach; lose weight and learn her limits. We will continue to monitor her progress as she works towards meeting these goals. Madinah has done well with maintaining her weight.  She would like to lose more and hopes the extra exercise at home will now help.  She has been doing well with her pressues and sugars both in class and at home.  She has a PCP appt today and took her print out from class with her to share her readings in class.      Expected Outcomes Patient will complete the program meeting both personal and program goals. Continue to montior  risk factors and work on weight loss               Core Components/Risk Factors/Patient Goals at Discharge (Final Review):   Goals and Risk Factor Review - 12/08/22 0909       Core Components/Risk Factors/Patient Goals Review   Personal Goals Review Weight Management/Obesity;Diabetes;Lipids;Hypertension    Review Dinisha has done well with maintaining her weight.  She would like to lose more and hopes the extra exercise at home will now help.  She has been doing well with her pressues and sugars both in class and at home.  She has a PCP appt today and took her print out from class with her to share her readings in class.    Expected Outcomes Continue to montior risk factors and work on weight loss             ITP Comments:  ITP Comments     Row Name 11/02/22 1425 11/07/22 1457 11/16/22 1317 12/14/22 0938     ITP Comments Patient attend orientation today.  Patient is attending Cardiac Rehabilitation Program.  Documentation for diagnosis can be found in [facility] office visit 10/20/22.  Reviewed medical chart, RPE, gym safety, and program guidelines.  Patient was fitted to equipment they will be using during rehab.  Patient is scheduled to start exercise on Monday November 07, 2022. First full day of exercise!  Patient was oriented to gym and equipment including functions, settings, policies, and procedures.  Patient's individual exercise prescription and treatment plan were reviewed.  All starting workloads were established based on the results of the 6 minute walk test done at initial orientation visit.  The plan for exercise progression was also introduced and progression will be customized based on patient's performance and goals. 30 day review completed. ITP sent to Dr. Dina Rich, Medical Director of Cardiac Rehab. Continue with ITP unless changes are made by physician. 30 day review completed. ITP sent to Dr. Dina Rich, Medical Director of Cardiac Rehab. Continue with ITP unless changes  are made by physician.             Comments: 30 day review

## 2022-12-16 ENCOUNTER — Encounter (HOSPITAL_COMMUNITY)
Admission: RE | Admit: 2022-12-16 | Payer: Medicare HMO | Source: Ambulatory Visit | Attending: Cardiology | Admitting: Cardiology

## 2022-12-16 DIAGNOSIS — I214 Non-ST elevation (NSTEMI) myocardial infarction: Secondary | ICD-10-CM | POA: Diagnosis not present

## 2022-12-16 DIAGNOSIS — Z951 Presence of aortocoronary bypass graft: Secondary | ICD-10-CM

## 2022-12-16 NOTE — Progress Notes (Signed)
Daily Session Note  Patient Details  Name: ABIOLA KUEHNLE MRN: [id number] Date of Birth: February 28, 1956 Referring Provider:   Flowsheet Row CARDIAC REHAB PHASE II ORIENTATION from 11/02/2022 in [facility] C[facility]AC REHABILITATION  Referring Provider Dina Rich MD  [Attending Cargiologist: Dr. Remi Deter McDowell]       Encounter Date: 12/16/2022  Check In:  Session Check In - 12/16/22 1420       Check-In   Supervising physician immediately available to respond to emergencies See telemetry face sheet for immediately available ER MD    Location [facility]    Staff Present Rodena Medin, RN, Pleas Koch, RN, BSN;Heather Fredric Mare, BS, Exercise Physiologist;Phyllis Billingsley, RN    Virtual Visit No    Medication changes reported     No    Fall or balance concerns reported    No    Warm-up and Cool-down Performed on first and last piece of equipment    Resistance Training Performed Yes    VAD Patient? No    PAD/SET Patient? No      Pain Assessment   Currently in Pain? No/denies    Multiple Pain Sites No             Capillary Blood Glucose: No results found for this or any previous visit (from the past 24 hour(s)).    Social History   Tobacco Use  Smoking Status Former   Current packs/day: 0.00   Average packs/day: 0.3 packs/day for 19.7 years (5.9 ttl pk-yrs)   Types: Cigarettes   Start date: 08/10/1980   Quit date: 05/02/2000   Years since quitting: 22.6  Smokeless Tobacco Never    Goals Met:  Independence with exercise equipment Exercise tolerated well No report of concerns or symptoms today Strength training completed today  Goals Unmet:  Not Applicable  Comments: Pt able to follow exercise prescription today without complaint.  Will continue to monitor for progression.    Dr. Dina Rich is Medical Director for [facility] Cardiac Rehab

## 2022-12-19 ENCOUNTER — Encounter (HOSPITAL_COMMUNITY): Payer: Medicare HMO

## 2022-12-21 ENCOUNTER — Encounter (HOSPITAL_COMMUNITY): Payer: Medicare HMO

## 2022-12-26 ENCOUNTER — Encounter (HOSPITAL_COMMUNITY): Payer: Medicare HMO

## 2022-12-28 ENCOUNTER — Encounter (HOSPITAL_COMMUNITY): Payer: Medicare HMO

## 2023-01-02 ENCOUNTER — Encounter (HOSPITAL_COMMUNITY): Payer: Medicare HMO

## 2023-01-04 ENCOUNTER — Encounter (HOSPITAL_COMMUNITY): Payer: Medicare HMO

## 2023-01-09 ENCOUNTER — Encounter (HOSPITAL_COMMUNITY): Payer: Medicare HMO

## 2023-01-11 ENCOUNTER — Encounter (HOSPITAL_COMMUNITY): Payer: Medicare HMO

## 2023-01-16 ENCOUNTER — Encounter (HOSPITAL_COMMUNITY): Payer: Medicare HMO

## 2023-01-18 ENCOUNTER — Encounter (HOSPITAL_COMMUNITY): Payer: Medicare HMO

## 2023-01-23 ENCOUNTER — Encounter (HOSPITAL_COMMUNITY): Payer: Medicare HMO

## 2023-01-25 ENCOUNTER — Encounter (HOSPITAL_COMMUNITY): Payer: Medicare HMO

## 2023-01-30 ENCOUNTER — Encounter (HOSPITAL_COMMUNITY): Payer: Medicare HMO

## 2023-02-01 ENCOUNTER — Encounter (HOSPITAL_COMMUNITY): Payer: Medicare HMO

## 2023-03-06 DIAGNOSIS — C44311 Basal cell carcinoma of skin of nose: Secondary | ICD-10-CM | POA: Diagnosis not present

## 2023-03-06 DIAGNOSIS — L570 Actinic keratosis: Secondary | ICD-10-CM | POA: Diagnosis not present

## 2023-03-06 DIAGNOSIS — X32XXXD Exposure to sunlight, subsequent encounter: Secondary | ICD-10-CM | POA: Diagnosis not present

## 2023-03-06 DIAGNOSIS — C44319 Basal cell carcinoma of skin of other parts of face: Secondary | ICD-10-CM | POA: Diagnosis not present

## 2023-03-09 DIAGNOSIS — C44311 Basal cell carcinoma of skin of nose: Secondary | ICD-10-CM | POA: Diagnosis not present

## 2023-03-09 DIAGNOSIS — C44319 Basal cell carcinoma of skin of other parts of face: Secondary | ICD-10-CM | POA: Diagnosis not present

## 2023-03-09 DIAGNOSIS — C4401 Basal cell carcinoma of skin of lip: Secondary | ICD-10-CM | POA: Diagnosis not present

## 2023-03-21 ENCOUNTER — Encounter: Payer: Self-pay | Admitting: Nurse Practitioner

## 2023-03-21 ENCOUNTER — Ambulatory Visit: Payer: Medicare HMO | Attending: Nurse Practitioner | Admitting: Nurse Practitioner

## 2023-03-21 VITALS — BP 126/72 | HR 67 | Ht 65.0 in | Wt 177.0 lb

## 2023-03-21 DIAGNOSIS — I255 Ischemic cardiomyopathy: Secondary | ICD-10-CM | POA: Diagnosis not present

## 2023-03-21 DIAGNOSIS — I10 Essential (primary) hypertension: Secondary | ICD-10-CM | POA: Diagnosis not present

## 2023-03-21 DIAGNOSIS — E785 Hyperlipidemia, unspecified: Secondary | ICD-10-CM | POA: Diagnosis not present

## 2023-03-21 DIAGNOSIS — I5020 Unspecified systolic (congestive) heart failure: Secondary | ICD-10-CM | POA: Diagnosis not present

## 2023-03-21 DIAGNOSIS — I2510 Atherosclerotic heart disease of native coronary artery without angina pectoris: Secondary | ICD-10-CM | POA: Diagnosis not present

## 2023-03-21 DIAGNOSIS — R002 Palpitations: Secondary | ICD-10-CM

## 2023-03-21 NOTE — Patient Instructions (Addendum)
Medication Instructions:  Your physician recommends that you continue on your current medications as directed. Please refer to the Current Medication list given to you today.   Labwork: None  Testing/Procedures: None  Follow-Up: Your physician recommends that you schedule a follow-up appointment in: 6 months with Dr. Diona Browner.   Any Other Special Instructions Will Be Listed Below (If Applicable).  If you need a refill on your cardiac medications before your next appointment, please call your pharmacy.

## 2023-03-21 NOTE — Progress Notes (Unsigned)
Office Visit    Patient Name: Suzanne Tapia Date of Encounter: 10/20/2022  PCP:  Rebekah Chesterfield, NP   [facility]  Cardiologist:  Nona Dell, MD  Advanced Practice Provider:  No care team member to display Electrophysiologist:  None   Chief Complaint    Suzanne Tapia is a 67 y.o. female with a hx of palpitations, CAD, s/p CABG x 3 and stenting, ICM/HFrEF, hyperlipidemia, left bundle branch block, history of chest pain, type 1 diabetes, and hypertension, who presents today for follow-up.   Past Medical History    Past Medical History:  Diagnosis Date   CAD (coronary artery disease)    Mild to moderate by cardiac catheterization December 2015   Essential hypertension    Heart failure with mildly reduced ejection fraction (HFmrEF) (HCC)    ICM EF 40-45%   History of palpitations    Negative CardioNet monitor 2012   Mixed hyperlipidemia    S/P CABG (coronary artery bypass graft)    s/p CABG x 3 including reverse saphenous vein graft sequenced to distal OM and proximal obtuse marginal artery, LIMA-mid LAD on August 16, 2022   Type 2 diabetes mellitus [facility])    Past Surgical History:  Procedure Laterality Date   BILATERAL SALPINGOOPHORECTOMY     BREAST LUMPECTOMY Bilateral    CARPAL TUNNEL RELEASE Left    LEFT HEART CATHETERIZATION WITH CORONARY ANGIOGRAM N/A 04/28/2014   Procedure: LEFT HEART CATHETERIZATION WITH CORONARY ANGIOGRAM;  Surgeon: Micheline Chapman, MD;  Location: [facility] CATH LAB;  Service: Cardiovascular;  Laterality: N/A;    Allergies  Allergies  Allergen Reactions   Sulfa Antibiotics Other (See Comments)    REACTION: hyperactivity    History of Present Illness    Suzanne Tapia is a very pleasant 67 y.o. female with a PMH as mentioned above.  Underwent diagnostic cardiac cath for accelerating angina in 2015 that revealed mild to moderate CAD, normal LV function.  Seen by Eligha Bridegroom, NP on 12/2021 for worsening angina.   [place] the beach a [place] prior when she had the most severe episode of chest pain.  Took a nitroglycerin, symptoms resolved.  Symptoms returned after moving around, reported taking several nitroglycerin over the span of several hours.  Also noted shortness of breath with exertion.  At the office visit, was advised to increase Imdur to 60 mg daily.  She noted some episodes of acid reflux, was advised to try Gaviscon or Pepcid.  Last seen by Eligha Bridegroom, NP for follow-up on 01/2022.  She noted an episode of chest pain after eating.  Symptoms relieved with nitroglycerin.  Stated anxiety may exacerbate her symptoms.  Took several sublingual nitroglycerin when neighbor was found deceased, as she thought her emotional upset would make her chest pain worse.  Denied any associated symptoms.  HCTZ added to medication regimen to help improve BP.  Admitted to [facility] in [place], [place] 08/2022.  Records indicate she underwent cardiac catheterization that ultimately required CABG x 3, including reverse saphenous vein graft sequenced to distal OM and proximal obtuse marginal artery, LIMA-mid LAD on August 16, 2022.Marland Kitchen She also underwent left atrial appendage ligation with 40 mm atrial clip.  Intraoperative TEE revealed preserved left ventricular function, no new wall motion abnormalities were noted on intraoperative TEE.  TTE revealed EF 40 to 45%, mildly depressed left ventricular systolic function, dyssynchronous septal wall motion consistent with intraventricular conduction delay or bundle branch  block, stage II DD, no significant valvular abnormalities. Cardiac catheterization was performed on August 19, 2022.  She underwent successful complex PCI of RCA using IVUS guidance, shockwave IVL, and overlapping Synergy drug-eluting stents.  Recommended for DAPT x 12 months with aspirin and Plavix, aggressive medical therapy and risk factor modification recommended.  I saw her for post  CABG follow-up with her husband on August 25, 2022. I reviewed her AVS that did show diagnosis of acute pulmonary edema that must have occurred during hospitalization, as well as NSTEMI and elevated troponins. She returned back to receive medical care close to home.  Husband had question regarding getting a chest x-ray done locally.  Patient was overall doing well since surgery.  Noted weakness and generalized musculoskeletal soreness, denied any chest pain.  Tolerating her medications well.  2 view chest x-ray was done postop and revealed cardiomegaly without heart failure, moderate left pleural effusion and associated atelectasis.  Was reviewed by Dr. Diona Browner who recommended increasing Lasix temporarily. Echocardiogram arranged-see report below.  She underwent repeat two-view chest x-ray on Sep 15, 2022 that showed resolution of pleural effusion.   Today she presents for follow-up. She is doing well, and has been walking regularly. She says she goes to her cardiac rehab assessment on July 3rd. Denies any chest pain, shortness of breath, palpitations, syncope, presyncope, dizziness, orthopnea, PND, swelling or significant weight changes, acute bleeding, or claudication. Does admit to generalized chest soreness from surgery noticed with certain movements. Requesting to eliminate some medications/pill burden.    EKGs/Labs/Other Studies Reviewed:   The following studies were reviewed today:   EKG:  EKG is not ordered today.    Echo 08/2022:   1. Left ventricular ejection fraction, by estimation, is 40 to 45%. The  left ventricle has mildly decreased function. The left ventricle  demonstrates regional wall motion abnormalities (see scoring  diagram/findings for description). There is mild left  ventricular hypertrophy. Left ventricular diastolic parameters are  consistent with Grade I diastolic dysfunction (impaired relaxation). The  average left ventricular global longitudinal strain is -11.0 %. The  global  longitudinal strain is abnormal.   2. Right ventricular systolic function was not well visualized. The right  ventricular size is normal. Tricuspid regurgitation signal is inadequate  for assessing PA pressure.   3. The mitral valve was not well visualized. No evidence of mitral valve  regurgitation. No evidence of mitral stenosis.   4. The aortic valve was not well visualized. Aortic valve regurgitation  is trivial. No aortic stenosis is present.   5. The inferior vena cava is normal in size with greater than 50%  respiratory variability, suggesting right atrial pressure of 3 mmHg.   Comparison(s): No prior Echocardiogram.  Recent Labs: No results found for requested labs within last 365 days.  Recent Lipid Panel    Component Value Date/Time   CHOL 175 04/15/2020 1557   TRIG 342 (H) 04/15/2020 1557   HDL 33 (L) 04/15/2020 1557   CHOLHDL 5.3 (H) 04/15/2020 1557   LDLCALC 96 04/15/2020 1557     Home Medications   Current Meds  Medication Sig   aspirin EC 81 MG tablet Take 81 mg by mouth daily.   atorvastatin (LIPITOR) 80 MG tablet Take 1 tablet (80 mg total) by mouth daily.   clopidogrel (PLAVIX) 75 MG tablet Take 1 tablet (75 mg total) by mouth daily.   escitalopram (LEXAPRO) 10 MG tablet Take 5 mg by mouth daily.   famotidine (PEPCID) 20 MG tablet  Take 20 mg by mouth daily.   fenofibrate 160 MG tablet Take 160 mg by mouth daily.   Insulin Glargine Solostar (LANTUS) 100 UNIT/ML Solostar Pen Inject 110 Units into the skin daily.   loratadine (CLARITIN) 10 MG tablet Take 10 mg by mouth daily.   metoprolol succinate (TOPROL-XL) 50 MG 24 hr tablet Take 0.5 tablets (25 mg total) by mouth daily. Take with or immediately following a meal.   nitroGLYCERIN (NITROSTAT) 0.4 MG SL tablet Place 1 tablet (0.4 mg total) under the tongue every 5 (five) minutes x 3 doses as needed for chest pain (If no relief after 2nd dose, proceed to the ED for an evaluation). U   NOVOLOG 100  UNIT/ML injection Inject into the skin. 55 units in am and 60 in pm   SALINE NASAL MIST NA Place 2 sprays into the nose as needed.   spironolactone (ALDACTONE) 25 MG tablet Take 1 tablet (25 mg total) by mouth daily.    furosemide (LASIX) 40 MG tablet Take 0.5 tablets (20 mg total) by mouth daily.   metoprolol tartrate (LOPRESSOR) 25 MG tablet Take 1 tablet (25 mg total) by mouth 2 (two) times daily.    potassium chloride (KLOR-CON) 10 MEQ tablet Take 1 tablet (10 mEq total) by mouth daily.   Review of Systems    All other systems reviewed and are otherwise negative except as noted above.  Physical Exam    VS:  There were no vitals taken for this visit. , BMI There is no height or weight on file to calculate BMI.  Wt Readings from Last 3 Encounters:  12/08/22 174 lb 9.6 oz (79.2 kg)  11/02/22 172 lb 12.8 oz (78.4 kg)  10/20/22 170 lb 12.8 oz (77.5 kg)     GEN: Well nourished, well developed, in no acute distress. HEENT: normal. Neck: Supple, no JVD, carotid bruits, or masses. Cardiac: S1/S2, RRR, no murmurs, rubs, or gallops. No clubbing, cyanosis, no edema.  Radials/PT 2+ and equal bilaterally.  Respiratory:  Respirations regular and unlabored, clear to auscultation bilaterally. MS: No deformity or atrophy. Skin: Warm and dry, no rash.  Sternal incision is healing well and approximated. Neuro:  Strength and sensation are intact. Psych: Normal affect.  Assessment & Plan    CAD, s/p CABG and stenting Stable with no anginal symptoms. Continue aspirin, Plavix, fenofibrate, Imdur, and nitroglycerin as needed. Will stop Metoprolol tartrate and switch Metoprolol to 25 mg daily to relieve pill burden. Heart healthy diet encouraged. Will be starting cardiac rehab soon.   2. HFrEF, ICM Stage C, NYHA class I. Echo 08/2022 revealed EF 40-45%, however Dr. Diona Browner who reviewed images states it appears around 35%. Euvolemic and well compensated on exam. Switching Metoprolol to Toprol XL 25 mg  daily, not a candidate for SGLT2i d/t hx of UTI's, will switch Lasix and K+ to daily PRN. Continue current GDMT. Will obtain the following labs: BMET. Low sodium diet, fluid restriction <2L, and daily weights encouraged. Educated to contact our office for weight gain of 2 lbs overnight or 5 lbs in one week.   3. Hyperlipidemia  Doing well after resuming atorvastatin. Continue current medication regimen. Plan to recheck FLP and LFT at next OV.  4. HTN Blood pressure stable. Switching Metoprolol to Toprol XL 25 mg daily. Will obtain BMET as mentioned below. Discussed to monitor BP at home at least 2 hours after medications and sitting for 5-10 minutes. Heart healthy diet encouraged.   5. Elevated serum creatinine, medication  management Most recent labs revealed sCr 1.23 with eGFR 49. Will repeat BMET. Avoid nephrotoxic agents. Encouraged adequate hydration. Continue to follow with PCP.   Disposition: Follow up in 4-6 months with Nona Dell, MD or APP.  Signed, Sharlene Dory, NP 03/21/2023, 12:40 PM [facility]

## 2023-03-24 DIAGNOSIS — C4401 Basal cell carcinoma of skin of lip: Secondary | ICD-10-CM | POA: Diagnosis not present

## 2023-03-24 DIAGNOSIS — C4441 Basal cell carcinoma of skin of scalp and neck: Secondary | ICD-10-CM | POA: Diagnosis not present

## 2023-03-24 DIAGNOSIS — C44311 Basal cell carcinoma of skin of nose: Secondary | ICD-10-CM | POA: Diagnosis not present

## 2023-04-07 DIAGNOSIS — C44319 Basal cell carcinoma of skin of other parts of face: Secondary | ICD-10-CM | POA: Diagnosis not present

## 2023-04-19 DIAGNOSIS — C4401 Basal cell carcinoma of skin of lip: Secondary | ICD-10-CM | POA: Diagnosis not present

## 2023-06-01 ENCOUNTER — Other Ambulatory Visit: Payer: Self-pay | Admitting: Nurse Practitioner

## 2023-10-05 ENCOUNTER — Ambulatory Visit: Attending: Cardiology | Admitting: Cardiology

## 2023-10-05 ENCOUNTER — Encounter: Payer: Self-pay | Admitting: Cardiology

## 2023-10-05 VITALS — BP 152/72 | HR 74 | Ht 65.0 in | Wt 176.0 lb

## 2023-10-05 DIAGNOSIS — E782 Mixed hyperlipidemia: Secondary | ICD-10-CM | POA: Diagnosis not present

## 2023-10-05 DIAGNOSIS — I5022 Chronic systolic (congestive) heart failure: Secondary | ICD-10-CM | POA: Diagnosis not present

## 2023-10-05 DIAGNOSIS — I25119 Atherosclerotic heart disease of native coronary artery with unspecified angina pectoris: Secondary | ICD-10-CM | POA: Diagnosis not present

## 2023-10-05 NOTE — Progress Notes (Signed)
    Cardiology Office Note  Date: 10/05/2023   ID: Suzanne Tapia, DOB 1955-05-18, MRN [id number]  History of Present Illness: Suzanne Tapia is a 68 y.o. female seen in November 2024 by Ms. Clementine Cutting NP, I reviewed the note.  Our last visit was in 2023.  She is here for a follow-up visit with her husband.  She does not report any angina or interval nitroglycerin  use.  Describes NYHA class I-II dyspnea, no fluid retention or unusual weight gain.  She reports having 1 episode of palpitations that lasted off and on during the day within the last 3 months.  This has not been a regular occurrence.  We went over her medications.  She reports compliance with therapy.  We discussed her last echocardiogram results from May 2024 as it relates to potential medication adjustments.  She is due for a follow-up echocardiogram at this point.  She will also have lab work with PCP this summer.  Physical Exam: VS:  BP (!) 152/72   Pulse 74   Ht 5\' 5"  (1.651 m)   Wt 176 lb (79.8 kg)   SpO2 96%   BMI 29.29 kg/m , BMI Body mass index is 29.29 kg/m.  Wt Readings from Last 3 Encounters:  10/05/23 176 lb (79.8 kg)  03/21/23 177 lb (80.3 kg)  12/08/22 174 lb 9.6 oz (79.2 kg)    General: Patient appears comfortable at rest. HEENT: Conjunctiva and lids normal. Lungs: Clear to auscultation, nonlabored breathing at rest. Cardiac: Regular rate and rhythm, no S3, 2/6 systolic murmur. Extremities: No pitting edema.  ECG:  An ECG dated 03/21/2023 was personally reviewed today and demonstrated:  Sinus rhythm with left bundle branch block.  Labwork:  August 2024: Hemoglobin 14.1, platelets 478, BUN 17, creatinine 0.95, GFR 66, potassium 4, AST 40, ALT 32, cholesterol 146, triglycerides 191, HDL 25, LDL 88, hemoglobin A1c 7.2%  Other Studies Reviewed Today:  No interval cardiac testing for review today.  Assessment and Plan:  1.  Multivessel CAD diagnosed in April 2024 in [place]  in the setting of ACS status  post CABG with SVG to OM 1 and OM 2 as well as LIMA to LAD.  Also underwent ligation of left atrial appendage at that time.  Subsequently underwent staged complex PCI of the RCA using IVUS and shockwave IVL with 2 overlapping DES at that time.  She does not report any angina or interval nitroglycerin  use.  Continues on aspirin  80 mg daily, Plavix  75 mg daily, and Lipitor 80 mg daily.  2.  HFmrEF with ischemic cardiomyopathy, LVEF 40 to 45% by echocardiogram in May 2024.  Plan to update echocardiogram.  She is currently on Toprol -XL 25 mg daily and Aldactone  25 mg daily.  Has Lasix  with potassium supplement available as needed.  We discussed further potential adjustments in GDMT, not an optimal candidate for SGLT2 inhibitors given history of UTIs.  Entresto would however be reasonable next addition.  We will follow-up with recommendations based on her echocardiogram.  2.  Mixed hyperlipidemia.  Follow-up lipid panel with PCP this summer.  She is on Lipitor 80 mg daily.  Ideally would like LDL 55 or less if possible.  May need further medication adjustments.  Disposition:  Follow up test results.  Signed, Gerard Knight, M.D., F.A.C.C. [facility]

## 2023-10-05 NOTE — Patient Instructions (Addendum)
 Medication Instructions:  Your physician recommends that you continue on your current medications as directed. Please refer to the Current Medication list given to you today.  Labwork: none  Testing/Procedures: Your physician has requested that you have an echocardiogram. Echocardiography is a painless test that uses sound waves to create images of your heart. It provides your doctor with information about the size and shape of your heart and how well your heart's chambers and valves are working. This procedure takes approximately one hour. There are no restrictions for this procedure. Please do NOT wear cologne, perfume, aftershave, or lotions (deodorant is allowed). Please arrive 15 minutes prior to your appointment time.  Please note: We ask at that you not bring children with you during ultrasound (echo/ vascular) testing. Due to room size and safety concerns, children are not allowed in the ultrasound rooms during exams. Our front office staff cannot provide observation of children in our lobby area while testing is being conducted. An adult accompanying a patient to their appointment will only be allowed in the ultrasound room at the discretion of the ultrasound technician under special circumstances. We apologize for any inconvenience.  Follow-Up: Your physician recommends that you schedule a follow-up appointment in: pending  Any Other Special Instructions Will Be Listed Below (If Applicable).  If you need a refill on your cardiac medications before your next appointment, please call your pharmacy.

## 2023-10-26 ENCOUNTER — Ambulatory Visit: Attending: Cardiology

## 2023-10-26 DIAGNOSIS — I5022 Chronic systolic (congestive) heart failure: Secondary | ICD-10-CM | POA: Diagnosis not present

## 2023-10-27 ENCOUNTER — Ambulatory Visit: Payer: Self-pay | Admitting: Cardiology

## 2023-10-27 DIAGNOSIS — Z79899 Other long term (current) drug therapy: Secondary | ICD-10-CM

## 2023-10-27 LAB — ECHOCARDIOGRAM COMPLETE
AR max vel: 1.79 cm2
AV Area VTI: 1.85 cm2
AV Area mean vel: 1.95 cm2
AV Mean grad: 6 mmHg
AV Peak grad: 13.4 mmHg
Ao pk vel: 1.83 m/s
Area-P 1/2: 2.26 cm2
Calc EF: 40.7 %
MV VTI: 1.55 cm2
S' Lateral: 3.9 cm
Single Plane A2C EF: 29.1 %
Single Plane A4C EF: 48.3 %

## 2023-10-30 ENCOUNTER — Telehealth: Payer: Self-pay

## 2023-10-30 MED ORDER — ENTRESTO 24-26 MG PO TABS: 1.0000 | ORAL_TABLET | Freq: Two times a day (BID) | ORAL | 11 refills | Status: DC

## 2023-10-30 NOTE — Telephone Encounter (Signed)
 Patient wants a call back directly from [place].

## 2023-10-30 NOTE — Telephone Encounter (Signed)
-----   Message from Jayson Sierras sent at 10/27/2023  6:50 PM EDT ----- Results reviewed.  I also reviewed the images.  LVEF is 30 to 35%, based on this we need to further adjust medications as per recent office discussion.  Suggest starting Entresto 24/26 mg twice  daily, follow-up BMET in 10 days.  We can go ahead and set up a follow-up visit within the next 4 to 6 weeks. ----- Message ----- From: Interface, [employer] One Seven Sent: 10/27/2023   4:27 PM EDT To: Jayson KANDICE Sierras, MD

## 2023-10-30 NOTE — Telephone Encounter (Signed)
 Sl[place], Suzanne Tapia  Spoke with patient - she is refusing to see anyone but Dr.McDowell so I have her set for September until we can do better.       Previous Messages    ----- Message ----- From: [name] LABOR, RN Sent: 10/30/2023  11:48 AM EDT To: Suzanne Tapia Suzanne Tapia Debera, MD 10/27/2023  6:50 PM EDT  Results reviewed.  I also reviewed the images.  LVEF is 30 to 35%, based on this we need to further adjust medications as per recent office discussion.  Suggest starting Entresto 24/26 mg twice daily, follow-up BMET in 10 days.  We can go ahead and set up a follow-up visit within the next 4 to 6 weeks.

## 2023-10-30 NOTE — Telephone Encounter (Signed)
 The patient has been notified of the result and verbalized understanding.  All questions (if any) were answered. [name] LABOR, RN 10/30/2023 11:54 AM    I sent rx Sim to [facility] along with 30 day Free Trial  I will mail lab slip for bmet at [facility]  [place] office will call her with f/I info

## 2023-11-06 NOTE — Telephone Encounter (Signed)
 Patient called, she states she received a letter from [facility] about blood work. I advised her that on 06/30 that Dr.  Debera recommended blood work to be done in 10 days. Patient understood. She is concerned about waiting until September to see Dr. Debera beings that he said to come back and see him in 4-6 weeks. FYI

## 2023-11-06 NOTE — Telephone Encounter (Signed)
 Left message to return call

## 2023-11-07 ENCOUNTER — Ambulatory Visit: Payer: Self-pay | Admitting: Cardiology

## 2023-11-07 ENCOUNTER — Other Ambulatory Visit (HOSPITAL_COMMUNITY)
Admission: RE | Admit: 2023-11-07 | Discharge: 2023-11-07 | Disposition: A | Discharge status: Home / Self Care | Source: Ambulatory Visit | Attending: Cardiology | Admitting: Cardiology

## 2023-11-07 ENCOUNTER — Telehealth: Payer: Self-pay | Admitting: Pharmacy Technician

## 2023-11-07 ENCOUNTER — Other Ambulatory Visit (HOSPITAL_COMMUNITY): Payer: Self-pay

## 2023-11-07 DIAGNOSIS — Z79899 Other long term (current) drug therapy: Secondary | ICD-10-CM | POA: Insufficient documentation

## 2023-11-07 LAB — BASIC METABOLIC PANEL WITH GFR
Anion gap: 7 (ref 5–15)
BUN: 26 mg/dL — ABNORMAL HIGH (ref 8–23)
CO2: 22 mmol/L (ref 22–32)
Calcium: 9.4 mg/dL (ref 8.9–10.3)
Chloride: 111 mmol/L (ref 98–111)
Creatinine, Ser: 1.01 mg/dL — ABNORMAL HIGH (ref 0.44–1.00)
GFR, Estimated: >60 mL/min (ref >60)
Glucose, Bld: 128 mg/dL — ABNORMAL HIGH (ref 70–99)
Potassium: 3.8 mmol/L (ref 3.5–5.1)
Sodium: 140 mmol/L (ref 135–145)

## 2023-11-07 NOTE — Telephone Encounter (Signed)
 Patient Advocate Encounter   The patient was approved for a [employer] grant that will help cover the cost of Entresto  Total amount awarded, 4500.00.  Effective: 10/08/23 - 10/06/24   APW:[id number] ERW:[id number] Hmnle:[id number] PI:[id number]  [employer] ID: [id number]   Pharmacy provided with approval and processing information. Patient informed via phone call

## 2023-11-07 NOTE — Telephone Encounter (Signed)
 Advised patient to go ahead and have lab work done. She is going to have it done today she states.  I advised her her appointment was probably pushed to september due to dr.McDowell availabilities.  She understood, I advised her once we got the lab work back if provider felts she needed to be sooner we could possibly get her in with an APP. Patient informed and verbalized understanding of plan. Patient states she cannot afford entresto  I will forward message to patient assistance to see if they can help her.  Patient is aware.

## 2023-11-09 NOTE — Telephone Encounter (Signed)
 Patient is returning your call.

## 2023-11-10 NOTE — Telephone Encounter (Signed)
 Per Debera, okay to add to hospital day. Scheduled 12/13/2023 [place] 1:40 pm with Debera.

## 2023-11-16 ENCOUNTER — Ambulatory Visit: Admitting: Cardiology

## 2023-11-28 ENCOUNTER — Other Ambulatory Visit: Payer: Self-pay | Admitting: Cardiology

## 2023-11-28 ENCOUNTER — Telehealth: Payer: Self-pay | Admitting: Cardiology

## 2023-11-28 ENCOUNTER — Telehealth: Payer: Self-pay | Admitting: Nurse Practitioner

## 2023-11-28 MED ORDER — SACUBITRIL-VALSARTAN 24-26 MG PO TABS: 1.0000 | ORAL_TABLET | Freq: Two times a day (BID) | ORAL | 11 refills | Status: AC

## 2023-11-28 NOTE — Telephone Encounter (Signed)
*  STAT* If patient is at the pharmacy, call can be transferred to refill team.   1. Which medications need to be refilled? (please list name of each medication and dose if known)   sacubitril -valsartan  (ENTRESTO ) 24-26 MG   2. Would you like to learn more about the convenience, safety, & potential cost savings by using the [facility]?   3. Are you open to using the [facility] (Type [facility]. ).   4. Which pharmacy/location (including street and city if local pharmacy) is medication to be sent to?  [facility] [place], [place] - [address]   5. Do they need a 30 day or 90 day supply?   90 day  Patient stated she is completely out of this medication.  Patient stated can also reach her this after noon at her husband's cell# [phone].

## 2023-11-30 NOTE — Telephone Encounter (Signed)
*  STAT* If patient is at the pharmacy, call can be transferred to refill team.   1. Which medications need to be refilled? (please list name of each medication and dose if known) metoprolol  succinate (TOPROL -XL) 50 MG 24 hr tablet    2. Would you like to learn more about the convenience, safety, & potential cost savings by using the [facility]?      3. Are you open to using the [facility] (Type [facility].  ).   4. Which pharmacy/location (including street and city if local pharmacy) is medication to be sent to?  [facility] [place], [place] - [address]     5. Do they need a 30 day or 90 day supply? 90 day  Patient is out of medication

## 2023-11-30 NOTE — Telephone Encounter (Signed)
 Rx sent to pharmacy

## 2023-12-12 ENCOUNTER — Ambulatory Visit: Admitting: Nurse Practitioner

## 2023-12-13 ENCOUNTER — Encounter: Payer: Self-pay | Admitting: Cardiology

## 2023-12-13 ENCOUNTER — Ambulatory Visit: Attending: Cardiology | Admitting: Cardiology

## 2023-12-13 VITALS — BP 116/68 | HR 66 | Wt 180.0 lb

## 2023-12-13 DIAGNOSIS — E782 Mixed hyperlipidemia: Secondary | ICD-10-CM

## 2023-12-13 DIAGNOSIS — I25119 Atherosclerotic heart disease of native coronary artery with unspecified angina pectoris: Secondary | ICD-10-CM | POA: Diagnosis not present

## 2023-12-13 DIAGNOSIS — I5020 Unspecified systolic (congestive) heart failure: Secondary | ICD-10-CM | POA: Diagnosis not present

## 2023-12-13 MED ORDER — EZETIMIBE 10 MG PO TABS: 10.0000 mg | ORAL_TABLET | Freq: Every day | ORAL | 3 refills | Status: AC

## 2023-12-13 NOTE — Patient Instructions (Signed)
 Medication Instructions:  Your physician has recommended you make the following change in your medication:   Start Zetia  10 mg Daily   *If you need a refill on your cardiac medications before your next appointment, please call your pharmacy*  Lab Work: NONE   If you have labs (blood work) drawn today and your tests are completely normal, you will receive your results only by: MyChart Message (if you have MyChart) OR A paper copy in the mail If you have any lab test that is abnormal or we need to change your treatment, we will call you to review the results.  Testing/Procedures: Your physician has requested that you have an echocardiogram. Echocardiography is a painless test that uses sound waves to create images of your heart. It provides your doctor with information about the size and shape of your heart and how well your heart's chambers and valves are working. This procedure takes approximately one hour. There are no restrictions for this procedure. Please do NOT wear cologne, perfume, aftershave, or lotions (deodorant is allowed). Please arrive 15 minutes prior to your appointment time.  Please note: We ask at that you not bring children with you during ultrasound (echo/ vascular) testing. Due to room size and safety concerns, children are not allowed in the ultrasound rooms during exams. Our front office staff cannot provide observation of children in our lobby area while testing is being conducted. An adult accompanying a patient to their appointment will only be allowed in the ultrasound room at the discretion of the ultrasound technician under special circumstances. We apologize for any inconvenience.   Follow-Up: At [facility], you and your health needs are our priority.  As part of our continuing mission to provide you with exceptional heart care, our providers are all part of one team.  This team includes your primary Cardiologist (physician) and Advanced Practice  Providers or APPs (Physician Assistants and Nurse Practitioners) who all work together to provide you with the care you need, when you need it.  Your next appointment:   3 month(s)  Provider:   You may see Jayson Sierras, MD or one of the following Advanced Practice Providers on your designated Care Team:   Laymon Qua, PA-C  [place], [place], [place]     We recommend signing up for the patient portal called MyChart.  Sign up information is provided on this After Visit Summary.  MyChart is used to connect with patients for Virtual Visits (Telemedicine).  Patients are able to view lab/test results, encounter notes, upcoming appointments, etc.  Non-urgent messages can be sent to your provider as well.   To learn more about what you can do with MyChart, go to [email or web address].   Other Instructions Thank you for choosing [facility]!

## 2023-12-13 NOTE — Progress Notes (Signed)
 Cardiology Office Note  Date: 12/13/2023   ID: Suzanne Tapia, DOB 05-08-1955, MRN [id number]  History of Present Illness: Suzanne Tapia is a 68 y.o. female last seen in June.  She is here with her husband for a follow-up visit.  Tolerating addition of Entresto  which was added since her last visit based on follow-up echocardiogram in June revealing LVEF down to the 30 to 35% range.  Clinically, feels better overall, NYHA class II dyspnea, no obvious fluid retention.  No exertional chest pain or nitroglycerin  use.  We went over her medications.  We also discussed her lipids from June.  Went over options for more optimal LDL control, she has been compliant with Lipitor 80 mg daily and fenofibrate 160 mg daily.  At this point plan initiation of Zetia  10 mg daily, discussed possibility of PCSK9 inhibitors as well.  Physical Exam: VS:  BP 116/68 (BP Location: Left Arm, Cuff Size: Large)   Pulse 66   Wt 180 lb (81.6 kg)   SpO2 96%   BMI 29.95 kg/m , BMI Body mass index is 29.95 kg/m.  Wt Readings from Last 3 Encounters:  12/13/23 180 lb (81.6 kg)  10/05/23 176 lb (79.8 kg)  03/21/23 177 lb (80.3 kg)    General: Patient appears comfortable at rest. HEENT: Conjunctiva and lids normal. Neck: Supple, no elevated JVP or carotid bruits. Lungs: Clear to auscultation, nonlabored breathing at rest. Cardiac: Regular rate and rhythm, no S3, 1/6 systolic murmur. Extremities: No pitting edema.  ECG:  An ECG dated 03/21/2023 was personally reviewed today and demonstrated:  Sinus rhythm with left bundle branch block.  Labwork: June 2025: Hemoglobin 14.5, platelets 462, cholesterol 156, triglycerides 187, HDL 23, LDL 100 11/07/2023: BUN 26; Creatinine, Ser 1.01; Potassium 3.8; Sodium 140     Component Value Date/Time   CHOL 175 04/15/2020 1557   TRIG 342 (H) 04/15/2020 1557   HDL 33 (L) 04/15/2020 1557   CHOLHDL 5.3 (H) 04/15/2020 1557   LDLCALC 96 04/15/2020 1557   Other Studies Reviewed  Today:  Echocardiogram 10/26/2023:  1. Left ventricular ejection fraction, by estimation, is 25 to 30%. Prior  echo images reviewed that showed moderate reduction in LVEF and Definity   was administered then. Left ventricular ejection fraction by 3D volume is  29 %. The left ventricle has  severely decreased function. Left ventricular endocardial border not  optimally defined to evaluate regional wall motion but there appears to be  severe hypokinesis of septum and inferior wall. Consider Limited Echo with  Definity  for accurate estimation of   LVEF and RWMA. There is mild left ventricular hypertrophy. Left  ventricular diastolic parameters are consistent with Grade I diastolic  dysfunction (impaired relaxation). Elevated left ventricular end-diastolic  pressure. The average left ventricular  global longitudinal strain is -9.4 %. The global longitudinal strain is  abnormal.   2. Right ventricular systolic function is normal. The right ventricular  size is normal. Tricuspid regurgitation signal is inadequate for assessing  PA pressure.   3. The mitral valve is normal in structure. Mild mitral valve  regurgitation. No evidence of mitral stenosis.   4. The aortic valve has an indeterminant number of cusps. Aortic valve  regurgitation is trivial. No aortic stenosis is present.   Assessment and Plan:  1.  HFrEF with ischemic cardiomyopathy, follow-up echocardiogram in June showed decrease in LVEF to approximately 30% with normal RV contraction.  Currently with NYHA class II symptoms, no fluid retention.  She has  tolerated the addition of Entresto  24/26 mg twice daily, blood pressure also better controlled today.  Not an optimal candidate for SGLT2 inhibitors given history of UTIs.  Plan to continue Toprol -XL 25 mg daily, Aldactone  25 mg daily, and Lasix  with potassium supplement as needed.  Check limited echocardiogram in 3 months with clinical reevaluation.  Can determine if further ischemic  workup necessary at that time depending on clinical status and subsequent LVEF.  2.  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  Also underwent ligation of left atrial appendage at that time.  Subsequently underwent staged complex PCI of the RCA using IVUS and shockwave IVL with two overlapping DES at that time.  She does not report any angina or interval nitroglycerin  use.  Continue aspirin  80 mg daily, Plavix  75 mg daily, and as needed nitroglycerin .   3.  Mixed hyperlipidemia.  LDL 100 in June.  Reports compliance with Lipitor 80 mg daily.  We discussed other treatment options for better LDL control and at this point we will initiate Zetia  10 mg daily.  She is also on fenofibrate 160 mg daily with hypertriglyceridemia.  Plan to reevaluate lipid panel in about 6 months.  Disposition:  Follow up 3 months.  Signed, Jayson JUDITHANN Sierras, M.D., F.A.C.C. [facility]

## 2024-01-17 ENCOUNTER — Ambulatory Visit: Admitting: Cardiology

## 2024-02-29 ENCOUNTER — Other Ambulatory Visit: Payer: Self-pay | Admitting: Cardiology

## 2024-03-14 ENCOUNTER — Ambulatory Visit: Attending: Cardiology

## 2024-03-14 ENCOUNTER — Other Ambulatory Visit (HOSPITAL_COMMUNITY)

## 2024-03-14 DIAGNOSIS — I5020 Unspecified systolic (congestive) heart failure: Secondary | ICD-10-CM | POA: Diagnosis not present

## 2024-03-14 MED ORDER — PERFLUTREN LIPID MICROSPHERE
1.0000 mL | INTRAVENOUS | Status: AC | PRN
Start: 2024-03-14 — End: 2024-03-14
  Administered 2024-03-14: 5 mL via INTRAVENOUS

## 2024-03-18 ENCOUNTER — Ambulatory Visit: Payer: Self-pay | Admitting: Cardiology

## 2024-03-18 LAB — ECHOCARDIOGRAM LIMITED
AR max vel: 2.85 cm2
AV Peak grad: 13.4 mmHg
Ao pk vel: 1.83 m/s
Area-P 1/2: 2.79 cm2
Calc EF: 47 %
Est EF: 50
S' Lateral: 3.2 cm
Single Plane A2C EF: 34.4 %
Single Plane A4C EF: 55.5 %

## 2024-03-20 ENCOUNTER — Ambulatory Visit: Attending: Cardiology | Admitting: Cardiology

## 2024-03-20 ENCOUNTER — Encounter: Payer: Self-pay | Admitting: Cardiology

## 2024-03-20 VITALS — BP 130/62 | HR 67 | Ht 65.0 in | Wt 182.8 lb

## 2024-03-20 DIAGNOSIS — I5020 Unspecified systolic (congestive) heart failure: Secondary | ICD-10-CM | POA: Diagnosis not present

## 2024-03-20 DIAGNOSIS — I25119 Atherosclerotic heart disease of native coronary artery with unspecified angina pectoris: Secondary | ICD-10-CM | POA: Diagnosis not present

## 2024-03-20 DIAGNOSIS — E782 Mixed hyperlipidemia: Secondary | ICD-10-CM

## 2024-03-20 NOTE — Patient Instructions (Signed)
 Medication Instructions:   Your physician recommends that you continue on your current medications as directed. Please refer to the Current Medication list given to you today.   Labwork: None today  Testing/Procedures: None today  Follow-Up: 6 months Dr.McDowell  Any Other Special Instructions Will Be Listed Below (If Applicable).  If you need a refill on your cardiac medications before your next appointment, please call your pharmacy.

## 2024-03-20 NOTE — Progress Notes (Signed)
 Cardiology Office Note  Date: 03/20/2024   ID: Suzanne Tapia, DOB July 16, 1955, MRN [id number]  History of Present Illness: Suzanne Tapia is a 68 y.o. female last seen in August.  She is here with her husband for a follow-up visit.  Doing well at this time, NYHA class I-II dyspnea, no fluid retention, her weight is stable.  We discussed the results of her follow-up echocardiogram, LVEF improved to 50% with medical therapy adjustments.  We went over her current medications and plan to continue with present cardiac regimen.  I did ask her to follow-up with PCP to get a fasting lipid profile.  If LDL has not come down toward goal we may need to consider her for Repatha.  Physical Exam: VS:  BP 130/62 (BP Location: Left Arm)   Pulse 67   Ht 5' 5 (1.651 m)   Wt 182 lb 12.8 oz (82.9 kg)   SpO2 95%   BMI 30.42 kg/m , BMI Body mass index is 30.42 kg/m.  Wt Readings from Last 3 Encounters:  03/20/24 182 lb 12.8 oz (82.9 kg)  12/13/23 180 lb (81.6 kg)  10/05/23 176 lb (79.8 kg)    General: Patient appears comfortable at rest. HEENT: Conjunctiva and lids normal. Neck: Supple, no elevated JVP or carotid bruits. Lungs: Clear to auscultation, nonlabored breathing at rest. Cardiac: Regular rate and rhythm, no S3, 1/6 systolic murmur.  ECG:  An ECG dated 03/21/2023 was personally reviewed today and demonstrated:  Sinus rhythm with left bundle branch block.  Labwork: June 2025: Cholesterol 156, triglycerides 187, HDL 23, LDL 100 11/07/2023: BUN 26; Creatinine, Ser 1.01; Potassium 3.8; Sodium 140   Other Studies Reviewed Today:  Echocardiogram 03/14/2024:  1. Left ventricular ejection fraction, by estimation, is 50%. The left  ventricle has low normal function. Left ventricular endocardial border not  optimally defined to evaluate regional wall motion. There is moderate left  ventricular hypertrophy. Left  ventricular diastolic parameters are consistent with Grade I diastolic   dysfunction (impaired relaxation). Elevated left atrial pressure.   2. Right ventricular systolic function is normal. The right ventricular  size is normal.   3. Limited echo, evaluate LV function   Assessment and Plan:  1.  HFimpEF with ischemic cardiomyopathy, follow-up echocardiogram in November shows improvement in LVEF to approximately 50%.  Clinically stable with NYHA class I-II dyspnea and no fluid retention.  Continue Toprol -XL 25 mg daily, Entresto  24/26 mg twice daily, and Aldactone  25 mg daily.  She has Lasix  40 mg tablets to take as needed along with potassium supplement.  Not an optimal candidate for SGLT2 inhibitors given history of UTIs.   2.  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  Also underwent ligation of left atrial appendage at that time.  Subsequently underwent staged complex PCI of the RCA using IVUS and shockwave IVL with two overlapping DES at that time.  No angina at this point.  Continue aspirin  81 mg daily, Plavix  75 mg daily, and as needed nitroglycerin .   3.  Mixed hyperlipidemia.  LDL 100 in June.  She is on Lasix  80 mg daily with most recent addition of Zetia  10 mg daily at last visit.  Follow-up FLP with PCP.  If LDL not at goal we will consider Repatha.  Disposition:  Follow up 6 months.  Signed, Jayson JUDITHANN Sierras, M.D.,  F.A.C.C. [facility]
